# Patient Record
Sex: Female | Born: 1980 | Race: Black or African American | Hispanic: No | Marital: Married | Smoking: Former smoker
Health system: Southern US, Community
[De-identification: ages and names within clinical notes are randomized; demographics above are authoritative.]

## PROBLEM LIST (undated history)

## (undated) DIAGNOSIS — L509 Urticaria, unspecified: Secondary | ICD-10-CM

## (undated) DIAGNOSIS — J302 Other seasonal allergic rhinitis: Secondary | ICD-10-CM

## (undated) DIAGNOSIS — K219 Gastro-esophageal reflux disease without esophagitis: Secondary | ICD-10-CM

## (undated) DIAGNOSIS — T783XXA Angioneurotic edema, initial encounter: Secondary | ICD-10-CM

## (undated) HISTORY — DX: Urticaria, unspecified: L50.9

## (undated) HISTORY — DX: Angioneurotic edema, initial encounter: T78.3XXA

---
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
## 2000-10-15 ENCOUNTER — Emergency Department (HOSPITAL_COMMUNITY): Admission: EM | Admit: 2000-10-15 | Discharge: 2000-10-16 | Payer: Self-pay | Admitting: Emergency Medicine

## 2007-04-21 ENCOUNTER — Inpatient Hospital Stay: Payer: Self-pay | Admitting: Obstetrics and Gynecology

## 2007-08-20 HISTORY — PX: DIAGNOSTIC LAPAROSCOPY: SUR761

## 2008-04-25 ENCOUNTER — Ambulatory Visit (HOSPITAL_COMMUNITY): Admission: AD | Admit: 2008-04-25 | Discharge: 2008-04-25 | Payer: Self-pay | Admitting: Obstetrics and Gynecology

## 2008-04-25 ENCOUNTER — Encounter (INDEPENDENT_AMBULATORY_CARE_PROVIDER_SITE_OTHER): Payer: Self-pay | Admitting: Obstetrics and Gynecology

## 2008-04-25 ENCOUNTER — Encounter: Payer: Self-pay | Admitting: Emergency Medicine

## 2009-01-04 IMAGING — US US OB TRANSVAGINAL
1 series · 13 of 28 positions shown · non-contrast
Comparison: None

CLINICAL DATA: Recent IUD placement, positive pregnancy test

OBSTETRIC <14 WK US AND TRANSVAGINAL OB US
TECHNIQUE: Both transabdominal and transvaginal ultrasound
examinations were performed for complete evaluation of the
gestation as well as the maternal uterus, adnexal regions, and
pelvic cul-de-sac.

[Series 1: unknown · 0.32mm/px · 13 of 63 slices shown]
[im 3/63]
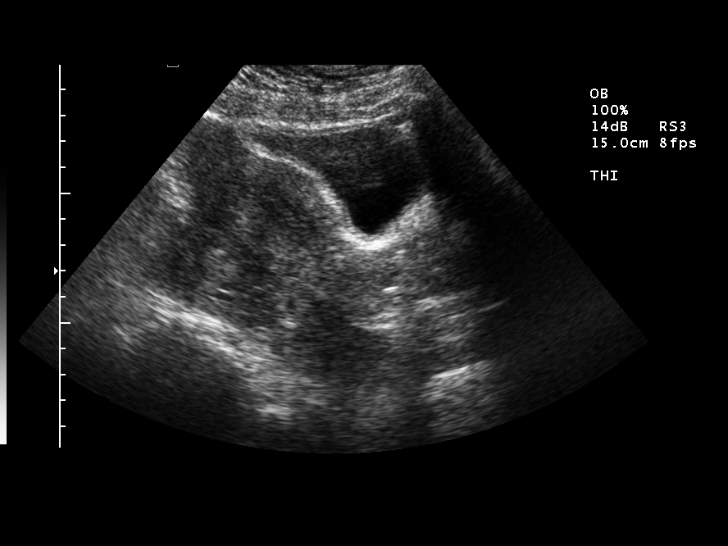
[im 7/63]
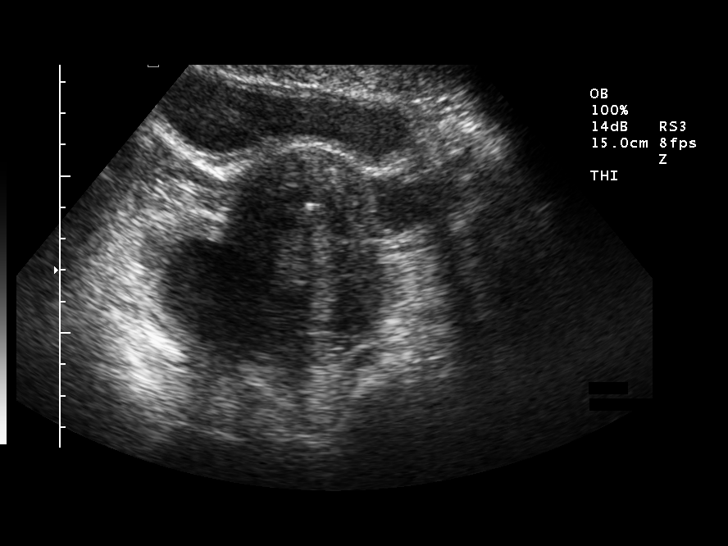
[im 12/63]
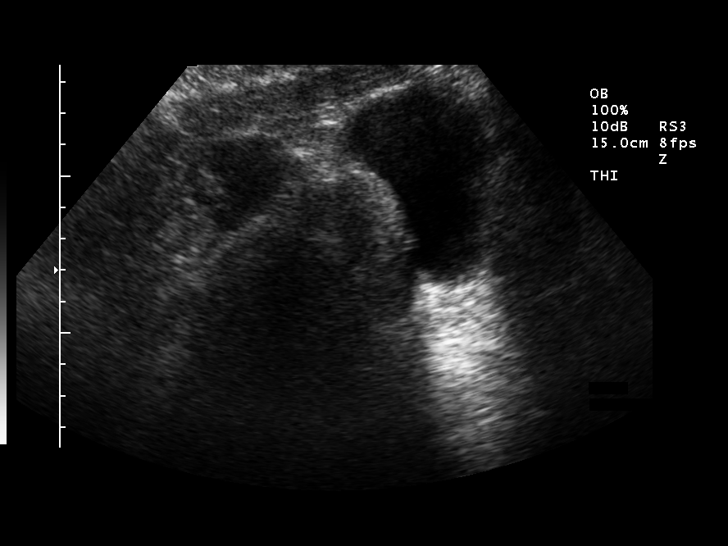
[im 17/63]
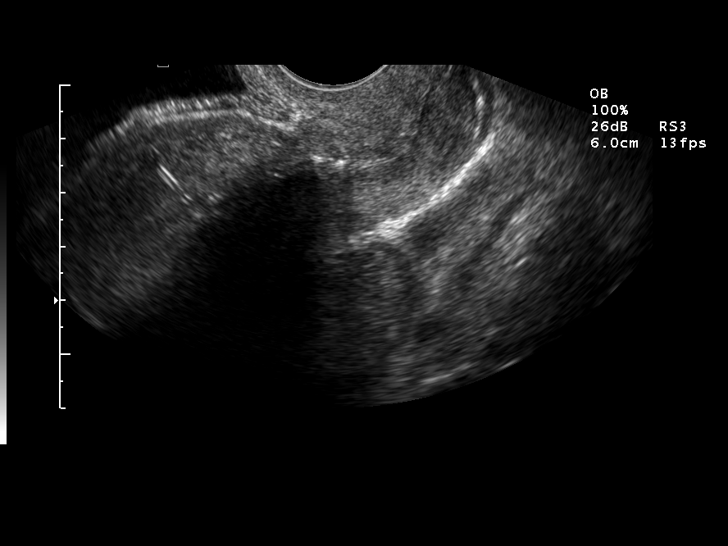
[im 21/63]
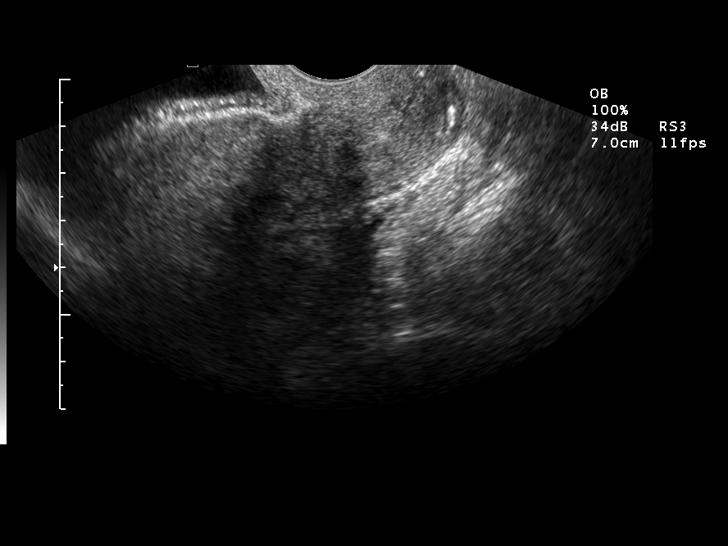
[im 26/63]
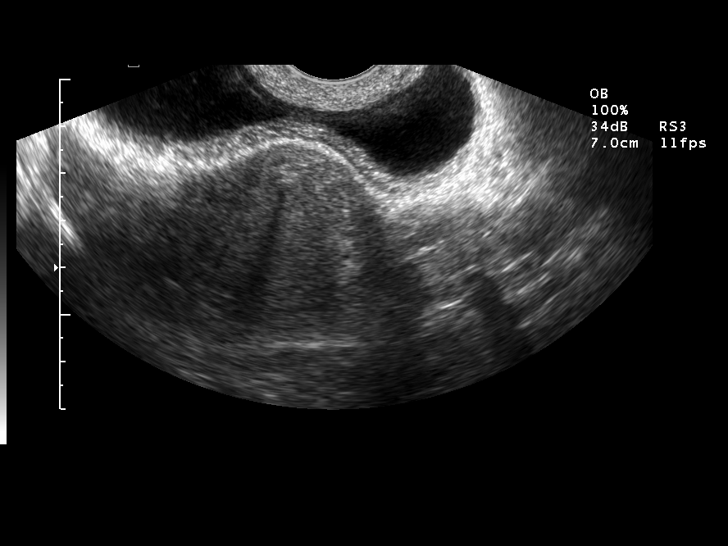
[im 33/63]
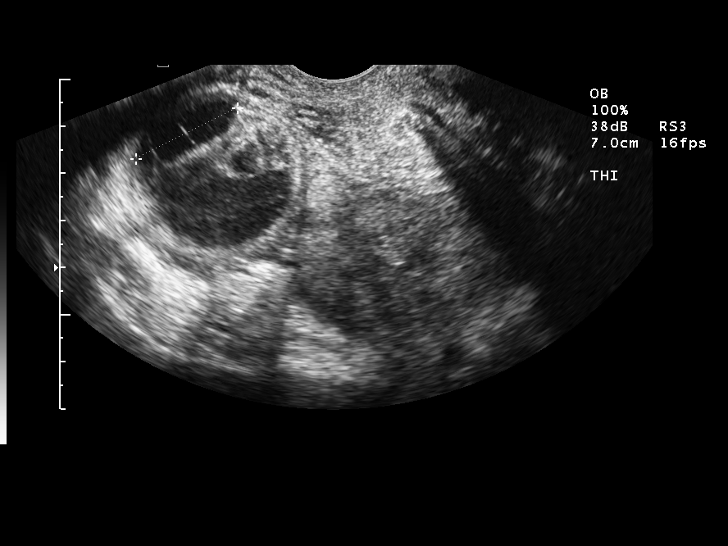
[im 37/63]
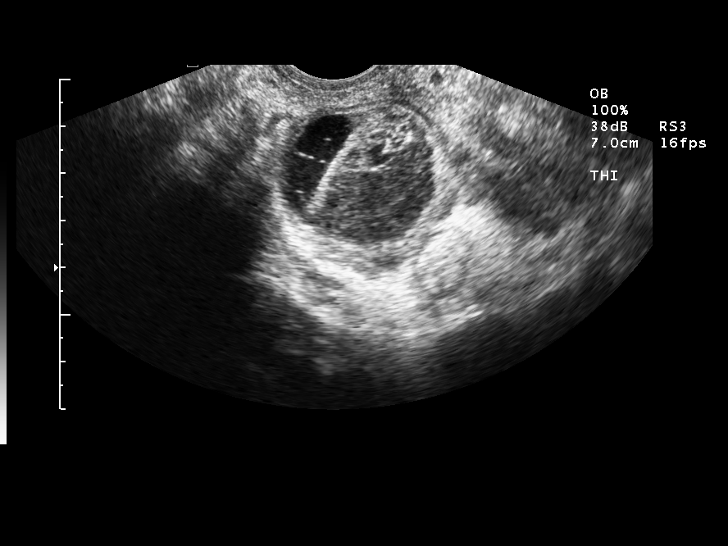
[im 42/63]
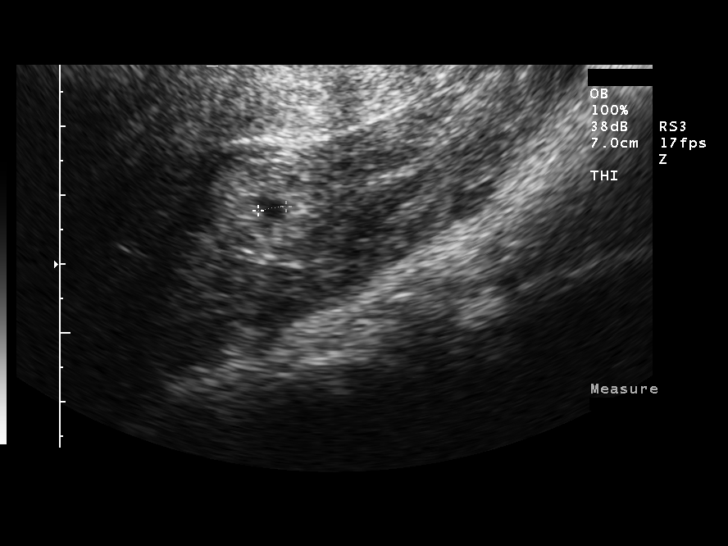
[im 46/63]
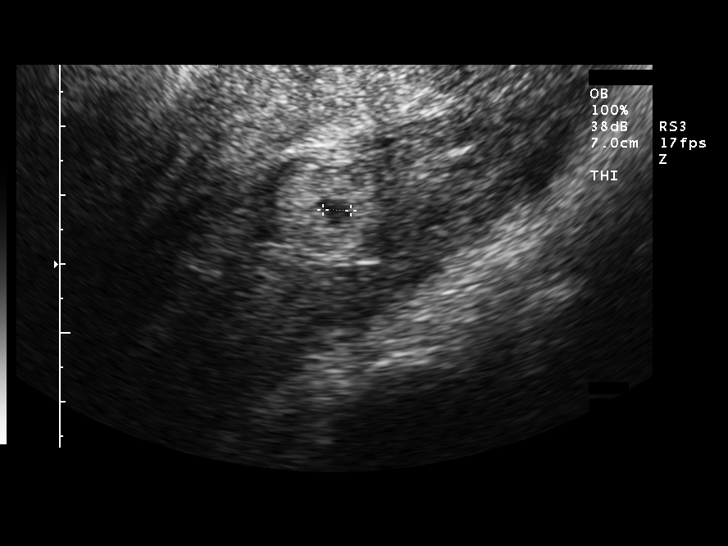
[im 51/63]
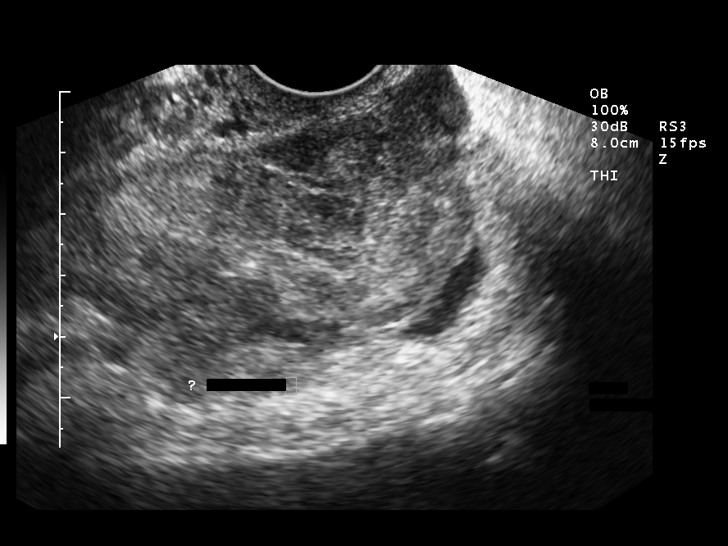
[im 56/63]
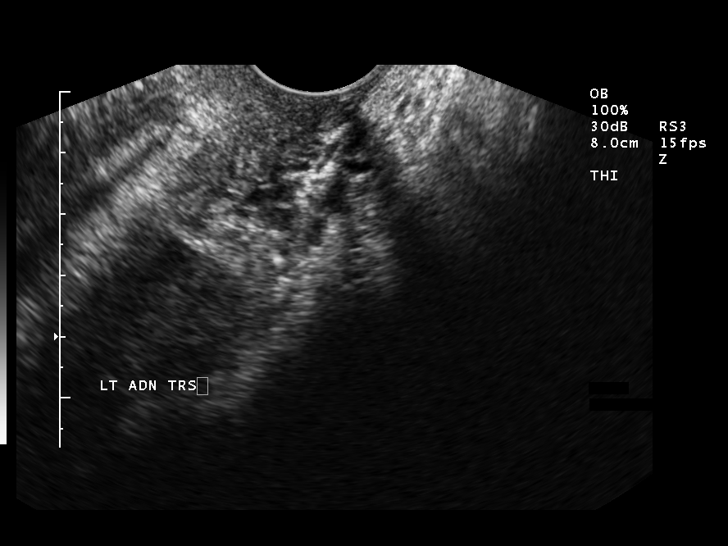
[im 60/63]
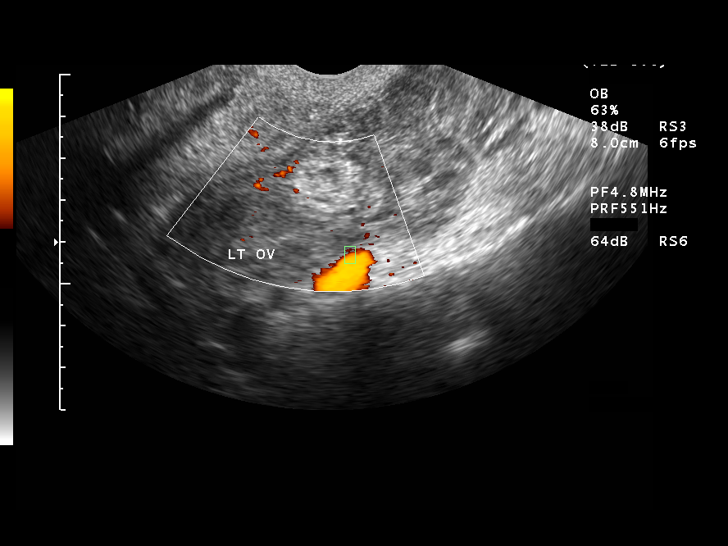

[13 of 28 positions shown; findings below may reference images not displayed]

Intrauterine gestational sac: Not visualized.  An IUD is in place.
Yolk sac: Not visualized
Embryo: Not visualized
Cardiac Activity: Not visualized

There is an echogenic, centrally echopenic mass adjacent to the
left ovary, likely a tubal ring.  Moderate fluid with internal
echoes noted in the cul-de-sac.  The left ovary is normal.

There is a thick-walled right ovarian cyst measuring 3.8 x 3.6 x
3.4 cm with lace-like internal echoes and possible thin internal
septation.
IMPRESSION: Probable tubal ring in the left adnexa consistent with ectopic
pregnancy and moderate fluid with possible internal clot/debris in
the cul-de-sac which may signify rupture.

No intrauterine gestational sac visualized with an IUD in place.

Probable hemorrhagic right ovarian cyst.

Critical test results telephoned to Dr. Edita by Dr. Friedbert at the
time of interpretation on date 04/25/2008 at time [DATE] a.m...

## 2009-09-12 ENCOUNTER — Other Ambulatory Visit: Admission: RE | Admit: 2009-09-12 | Discharge: 2009-09-12 | Payer: Self-pay | Admitting: Family Medicine

## 2011-01-01 NOTE — Op Note (Signed)
NAME:  Teresa Kennedy, Teresa Kennedy              ACCOUNT NO.:  [id number]   MEDICAL RECORD NO.:  [id number]          PATIENT TYPE:  AMB   LOCATION:  SDC                           FACILITY:  [facility]   PHYSICIAN:  Maxie Better, M.D.DATE OF BIRTH:  Jan 22, 1981   DATE OF PROCEDURE:  04/25/2008  DATE OF DISCHARGE:                               OPERATIVE REPORT   PREOPERATIVE DIAGNOSIS:  Presumed ruptured left ectopic pregnancy.   PROCEDURE:  Diagnostic laparoscopy, cauterization of left ovarian cyst  wall, evacuation of hemoperitoneum, and removal of IUD.   POSTOPERATIVE DIAGNOSIS:  Ruptured left ovarian cyst, pregnancy.   ANESTHESIA:  General.   SURGEON:  Maxie Better, MD   ASSISTANT:  None.   PROCEDURE:  Under adequate general anesthesia, the patient was placed in  the dorsal lithotomy position.  She was sterilely prepped and draped in  the usual fashion.  An indwelling Foley catheter was sterilely placed.  Bivalve speculum placed in vagina.  IUD string was visualized and  removed without incident.  Single-tooth tenaculum was placed in the  anterior lip of the cervix.  A Kahn cannula was introduced into the  cervical os and attached tenaculum for manipulation of the uterus.  The  bivalve speculum was then removed.  Attention was then turned to the  abdomen.  Marcaine 0.25% was injected infraumbilically and an  infraumbilical incision was made.  Veress needle was introduced, tested  with normal saline.  Carbon dioxide was insufflated, opening pressure of  6 was noted.  2.5 L of CO2 was insufflated.  Veress needle was removed.  A 10-mm disposable trocar was introduced into the abdomen without  incident.  A lighted video laparoscope was placed through that port.  On  entering the abdominal cavity, hemoperitoneum was noted.  A second  incision was then made suprapubically along the previous Pfannenstiel  skin incision.  A 5-mm port was placed under direct visualization.  Initial inspection  revealed normal right tube and right ovary with  ovarian cyst.  Left ovary appeared to have a distal portion of it  bleeding and the left tube had appeared to be normal, however,  evacuation of clots was necessary in order to further assess the  situation.  A suction apparatus was placed which was limited by its  size.  The skin incision was extended and 5 mm port and exchanged and  the hemoperitoneum was evacuated, approximately 300 mL worth of clotted  blood.  On further inspection, it was noted that again the left tube  appeared normal with the distal end fimbriated had no evidence of  possible tubal abortion.  There was no bleeding at the fimbriated end at  all.  The tube did not appear distended.  There was no evidence of  darkened area on the tube to suggest pregnancy.  The ovarian cyst wall  that was bleeding was cauterized.  Abdomen was irrigated, suctioned,  recauterization of that area until final hemostasis was accomplished.  The clotted material was sent off for pathology.  Decision was then made  to not remove the tube in the light of this finding.  The procedure was  then felt to be complete.  The suprapubic site was removed.  The abdomen  was deflated and the infraumbilical site then removed.  The deep layers  was closed with 0 Vicryl stitch and the skin approximated using  Dermabond.  The instruments in the vagina were removed.  Specimen was  the hemoperitoneum collection sent for possible pregnancy.  Estimated  blood loss from the procedure itself was minimal.  Complication was  none.  The patient tolerated the procedure well and was transferred to  recovery room in stable condition.      Maxie Better, M.D.  Electronically Signed     [place]/MEDQ  D:  04/25/2008  T:  04/26/2008  Job:  [id number]

## 2011-01-18 ENCOUNTER — Other Ambulatory Visit: Payer: Self-pay | Admitting: Obstetrics and Gynecology

## 2011-03-25 LAB — HIV ANTIBODY (ROUTINE TESTING W REFLEX): HIV: NONREACTIVE

## 2011-03-25 LAB — RUBELLA ANTIBODY, IGM: Rubella: IMMUNE

## 2011-03-25 LAB — ANTIBODY SCREEN: Antibody Screen: NEGATIVE

## 2011-03-25 LAB — ABO/RH: RH Type: POSITIVE

## 2011-05-22 LAB — CBC
MCHC: 33.6
RBC: 3.85 — ABNORMAL LOW
RDW: 13.7

## 2011-05-22 LAB — DIFFERENTIAL
Eosinophils Absolute: 0.1
Eosinophils Relative: 1
Lymphocytes Relative: 24
Lymphs Abs: 2.6
Monocytes Absolute: 0.4

## 2011-05-22 LAB — URINALYSIS, ROUTINE W REFLEX MICROSCOPIC
Bilirubin Urine: NEGATIVE
Glucose, UA: NEGATIVE
Hgb urine dipstick: NEGATIVE
Specific Gravity, Urine: 1.023

## 2011-05-22 LAB — GC/CHLAMYDIA PROBE AMP, GENITAL
Chlamydia, DNA Probe: NEGATIVE
GC Probe Amp, Genital: NEGATIVE

## 2011-05-22 LAB — COMPREHENSIVE METABOLIC PANEL
ALT: 12
AST: 18
Albumin: 3.5
Calcium: 9
GFR calc Af Amer: >60
Potassium: 3.2 — ABNORMAL LOW
Sodium: 138
Total Protein: 6.7

## 2011-05-22 LAB — WET PREP, GENITAL: Trich, Wet Prep: NONE SEEN

## 2011-05-22 LAB — HCG, QUANTITATIVE, PREGNANCY: hCG, Beta Chain, Quant, S: 1497 — ABNORMAL HIGH

## 2011-05-22 LAB — ABO/RH: ABO/RH(D): O POS

## 2011-08-21 NOTE — OR Nursing (Signed)
No BMI date of posting

## 2011-10-21 ENCOUNTER — Encounter (HOSPITAL_COMMUNITY): Payer: Self-pay | Admitting: Pharmacist

## 2011-10-25 ENCOUNTER — Other Ambulatory Visit: Payer: Self-pay | Admitting: Obstetrics and Gynecology

## 2011-10-31 ENCOUNTER — Encounter (HOSPITAL_COMMUNITY): Payer: Self-pay

## 2011-11-04 ENCOUNTER — Encounter (HOSPITAL_COMMUNITY)
Admission: RE | Admit: 2011-11-04 | Discharge: 2011-11-04 | Disposition: A | Discharge status: Home / Self Care | Payer: BC Managed Care – PPO | Source: Ambulatory Visit | Attending: Obstetrics and Gynecology | Admitting: Obstetrics and Gynecology

## 2011-11-04 ENCOUNTER — Encounter (HOSPITAL_COMMUNITY): Payer: Self-pay

## 2011-11-04 HISTORY — DX: Gastro-esophageal reflux disease without esophagitis: K21.9

## 2011-11-04 HISTORY — DX: Other seasonal allergic rhinitis: J30.2

## 2011-11-04 LAB — CBC
Platelets: 249 10*3/uL (ref 150–400)
RDW: 14 % (ref 11.5–15.5)
WBC: 10.3 10*3/uL (ref 4.0–10.5)

## 2011-11-04 LAB — SURGICAL PCR SCREEN: Staphylococcus aureus: NEGATIVE

## 2011-11-04 NOTE — Patient Instructions (Addendum)
   Your procedure is scheduled on: Wednesday March 20th  Enter through the [employer] [facility] at: 1 PM Pick up the phone at the desk and dial [phone] and inform us of your arrival.  Please call this number if you have any problems the morning of surgery: [phone]  Remember: Do not eat food after midnight: Tuesday Do not drink clear liquids after: 10:30 am Wednesday Take these medicines the morning of surgery with a SIP OF WATER: none  Do not wear jewelry, make-up, or FINGER nail polish Do not wear lotions, powders, perfumes or deodorant. Do not shave 48 hours prior to surgery. Do not bring valuables to the hospital. Contacts, dentures or bridgework may not be worn into surgery.  Leave suitcase in the car. After Surgery it may be brought to your room. For patients being admitted to the hospital, checkout time is 11:00am the day of discharge.     Remember to use your hibiclens as instructed.Please shower with 1/2 bottle the evening before your surgery and the other 1/2 bottle the morning of surgery. Neck down avoiding private area.

## 2011-11-05 LAB — RPR: RPR Ser Ql: NONREACTIVE

## 2011-11-05 MED ORDER — DEXTROSE 5 % IV SOLN
2.0000 g | INTRAVENOUS | Status: AC
  Administered 2011-11-06: 2 g via INTRAVENOUS
  Filled 2011-11-05: qty 2

## 2011-11-06 ENCOUNTER — Inpatient Hospital Stay (HOSPITAL_COMMUNITY): Payer: BC Managed Care – PPO

## 2011-11-06 ENCOUNTER — Encounter (HOSPITAL_COMMUNITY): Payer: Self-pay | Admitting: *Deleted

## 2011-11-06 ENCOUNTER — Encounter (HOSPITAL_COMMUNITY): Payer: Self-pay

## 2011-11-06 ENCOUNTER — Inpatient Hospital Stay (HOSPITAL_COMMUNITY)
Admission: RE | Admit: 2011-11-06 | Discharge: 2011-11-09 | DRG: 370 | Disposition: A | Discharge status: Home / Self Care | Payer: BC Managed Care – PPO | Source: Ambulatory Visit | Attending: Obstetrics and Gynecology | Admitting: Obstetrics and Gynecology

## 2011-11-06 ENCOUNTER — Encounter (HOSPITAL_COMMUNITY)
Admission: RE | Disposition: A | Discharge status: Home / Self Care | Payer: Self-pay | Source: Ambulatory Visit | Attending: Obstetrics and Gynecology

## 2011-11-06 ENCOUNTER — Encounter (HOSPITAL_COMMUNITY): Payer: Self-pay | Admitting: Anesthesiology

## 2011-11-06 DIAGNOSIS — O9903 Anemia complicating the puerperium: Secondary | ICD-10-CM | POA: Diagnosis present

## 2011-11-06 DIAGNOSIS — Z01818 Encounter for other preprocedural examination: Secondary | ICD-10-CM

## 2011-11-06 DIAGNOSIS — D509 Iron deficiency anemia, unspecified: Secondary | ICD-10-CM | POA: Diagnosis present

## 2011-11-06 DIAGNOSIS — Z302 Encounter for sterilization: Secondary | ICD-10-CM

## 2011-11-06 DIAGNOSIS — O34219 Maternal care for unspecified type scar from previous cesarean delivery: Principal | ICD-10-CM | POA: Diagnosis present

## 2011-11-06 DIAGNOSIS — Z01812 Encounter for preprocedural laboratory examination: Secondary | ICD-10-CM

## 2011-11-06 SURGERY — Surgical Case
Anesthesia: Spinal | Site: Abdomen | Laterality: Bilateral | Wound class: Clean Contaminated

## 2011-11-06 MED ORDER — OXYTOCIN 20 UNITS IN LACTATED RINGERS INFUSION - SIMPLE
INTRAVENOUS | Status: DC | PRN
  Administered 2011-11-06: 20 [IU] via INTRAVENOUS

## 2011-11-06 MED ORDER — ONDANSETRON HCL 4 MG/2ML IJ SOLN: 4.0000 mg | Freq: Three times a day (TID) | INTRAMUSCULAR | Status: DC | PRN

## 2011-11-06 MED ORDER — MEPERIDINE HCL 25 MG/ML IJ SOLN: 6.2500 mg | INTRAMUSCULAR | Status: DC | PRN

## 2011-11-06 MED ORDER — FERROUS SULFATE 325 (65 FE) MG PO TABS
325.0000 mg | ORAL_TABLET | Freq: Two times a day (BID) | ORAL | Status: DC
  Administered 2011-11-07 – 2011-11-09 (×5): 325 mg via ORAL
  Filled 2011-11-06 (×5): qty 1

## 2011-11-06 MED ORDER — DIBUCAINE 1 % RE OINT: 1.0000 "application " | TOPICAL_OINTMENT | RECTAL | Status: DC | PRN

## 2011-11-06 MED ORDER — BISACODYL 10 MG RE SUPP: 10.0000 mg | Freq: Every day | RECTAL | Status: DC | PRN

## 2011-11-06 MED ORDER — NALBUPHINE HCL 10 MG/ML IJ SOLN: 5.0000 mg | INTRAMUSCULAR | Status: DC | PRN

## 2011-11-06 MED ORDER — PHENYLEPHRINE 40 MCG/ML (10ML) SYRINGE FOR IV PUSH (FOR BLOOD PRESSURE SUPPORT)
PREFILLED_SYRINGE | INTRAVENOUS | Status: AC
  Filled 2011-11-06: qty 5

## 2011-11-06 MED ORDER — SODIUM CHLORIDE 0.9 % IJ SOLN: 3.0000 mL | INTRAMUSCULAR | Status: DC | PRN

## 2011-11-06 MED ORDER — KETOROLAC TROMETHAMINE 30 MG/ML IJ SOLN: 30.0000 mg | Freq: Four times a day (QID) | INTRAMUSCULAR | Status: DC | PRN

## 2011-11-06 MED ORDER — NALOXONE HCL 0.4 MG/ML IJ SOLN: 0.4000 mg | INTRAMUSCULAR | Status: DC | PRN

## 2011-11-06 MED ORDER — KETOROLAC TROMETHAMINE 30 MG/ML IJ SOLN
15.0000 mg | Freq: Once | INTRAMUSCULAR | Status: AC | PRN
  Administered 2011-11-06: 30 mg via INTRAVENOUS

## 2011-11-06 MED ORDER — FENTANYL CITRATE 0.05 MG/ML IJ SOLN
INTRAMUSCULAR | Status: DC | PRN
  Administered 2011-11-06: 12.5 ug via INTRATHECAL

## 2011-11-06 MED ORDER — BUPIVACAINE HCL (PF) 0.25 % IJ SOLN
INTRAMUSCULAR | Status: AC
  Filled 2011-11-06: qty 30

## 2011-11-06 MED ORDER — OXYTOCIN 10 UNIT/ML IJ SOLN
INTRAMUSCULAR | Status: AC
  Filled 2011-11-06: qty 4

## 2011-11-06 MED ORDER — LANOLIN HYDROUS EX OINT: 1.0000 "application " | TOPICAL_OINTMENT | CUTANEOUS | Status: DC | PRN

## 2011-11-06 MED ORDER — MORPHINE SULFATE 0.5 MG/ML IJ SOLN
INTRAMUSCULAR | Status: AC
  Filled 2011-11-06: qty 10

## 2011-11-06 MED ORDER — MEASLES, MUMPS & RUBELLA VAC ~~LOC~~ INJ: 0.5000 mL | INJECTION | Freq: Once | SUBCUTANEOUS | Status: DC

## 2011-11-06 MED ORDER — PHENYLEPHRINE HCL 10 MG/ML IJ SOLN
INTRAMUSCULAR | Status: DC | PRN
  Administered 2011-11-06: 40 ug via INTRAVENOUS
  Administered 2011-11-06: 80 ug via INTRAVENOUS
  Administered 2011-11-06: 40 ug via INTRAVENOUS
  Administered 2011-11-06: 80 ug via INTRAVENOUS
  Administered 2011-11-06: 40 ug via INTRAVENOUS
  Administered 2011-11-06: 80 ug via INTRAVENOUS
  Administered 2011-11-06: 40 ug via INTRAVENOUS
  Administered 2011-11-06: 80 ug via INTRAVENOUS

## 2011-11-06 MED ORDER — DIPHENHYDRAMINE HCL 50 MG/ML IJ SOLN: 25.0000 mg | INTRAMUSCULAR | Status: DC | PRN

## 2011-11-06 MED ORDER — EPHEDRINE 5 MG/ML INJ
INTRAVENOUS | Status: AC
  Filled 2011-11-06: qty 10

## 2011-11-06 MED ORDER — PHENYLEPHRINE 40 MCG/ML (10ML) SYRINGE FOR IV PUSH (FOR BLOOD PRESSURE SUPPORT)
PREFILLED_SYRINGE | INTRAVENOUS | Status: AC
  Filled 2011-11-06: qty 15

## 2011-11-06 MED ORDER — BUPIVACAINE IN DEXTROSE 0.75-8.25 % IT SOLN
INTRATHECAL | Status: DC | PRN
  Administered 2011-11-06: 1.4 mg via INTRATHECAL

## 2011-11-06 MED ORDER — DIPHENHYDRAMINE HCL 25 MG PO CAPS: 25.0000 mg | ORAL_CAPSULE | ORAL | Status: DC | PRN

## 2011-11-06 MED ORDER — TETANUS-DIPHTH-ACELL PERTUSSIS 5-2.5-18.5 LF-MCG/0.5 IM SUSP: 0.5000 mL | Freq: Once | INTRAMUSCULAR | Status: DC

## 2011-11-06 MED ORDER — KETOROLAC TROMETHAMINE 30 MG/ML IJ SOLN
INTRAMUSCULAR | Status: AC
  Administered 2011-11-06: 30 mg via INTRAVENOUS
  Filled 2011-11-06: qty 1

## 2011-11-06 MED ORDER — SODIUM CHLORIDE 0.9 % IV SOLN: 1.0000 ug/kg/h | INTRAVENOUS | Status: DC | PRN

## 2011-11-06 MED ORDER — OXYCODONE-ACETAMINOPHEN 5-325 MG PO TABS
1.0000 | ORAL_TABLET | ORAL | Status: DC | PRN
  Administered 2011-11-07 – 2011-11-09 (×8): 1 via ORAL
  Filled 2011-11-06 (×8): qty 1

## 2011-11-06 MED ORDER — ONDANSETRON HCL 4 MG PO TABS: 4.0000 mg | ORAL_TABLET | ORAL | Status: DC | PRN

## 2011-11-06 MED ORDER — HYDROMORPHONE HCL PF 1 MG/ML IJ SOLN: 0.2500 mg | INTRAMUSCULAR | Status: DC | PRN

## 2011-11-06 MED ORDER — ZOLPIDEM TARTRATE 5 MG PO TABS: 5.0000 mg | ORAL_TABLET | Freq: Every evening | ORAL | Status: DC | PRN

## 2011-11-06 MED ORDER — SENNOSIDES-DOCUSATE SODIUM 8.6-50 MG PO TABS
2.0000 | ORAL_TABLET | Freq: Every day | ORAL | Status: DC
  Administered 2011-11-06 – 2011-11-08 (×3): 2 via ORAL

## 2011-11-06 MED ORDER — ONDANSETRON HCL 4 MG/2ML IJ SOLN: 4.0000 mg | INTRAMUSCULAR | Status: DC | PRN

## 2011-11-06 MED ORDER — ATROPINE SULFATE 0.4 MG/ML IJ SOLN
INTRAMUSCULAR | Status: AC
  Filled 2011-11-06: qty 1

## 2011-11-06 MED ORDER — DIPHENHYDRAMINE HCL 25 MG PO CAPS: 25.0000 mg | ORAL_CAPSULE | Freq: Four times a day (QID) | ORAL | Status: DC | PRN

## 2011-11-06 MED ORDER — FLEET ENEMA 7-19 GM/118ML RE ENEM: 1.0000 | ENEMA | Freq: Every day | RECTAL | Status: DC | PRN

## 2011-11-06 MED ORDER — EPHEDRINE SULFATE 50 MG/ML IJ SOLN
INTRAMUSCULAR | Status: DC | PRN
  Administered 2011-11-06: 10 mg via INTRAVENOUS
  Administered 2011-11-06: 15 mg via INTRAVENOUS
  Administered 2011-11-06: 20 mg via INTRAVENOUS
  Administered 2011-11-06 (×2): 5 mg via INTRAVENOUS
  Administered 2011-11-06: 10 mg via INTRAVENOUS

## 2011-11-06 MED ORDER — PROMETHAZINE HCL 25 MG/ML IJ SOLN: 6.2500 mg | INTRAMUSCULAR | Status: DC | PRN

## 2011-11-06 MED ORDER — IBUPROFEN 600 MG PO TABS
600.0000 mg | ORAL_TABLET | Freq: Four times a day (QID) | ORAL | Status: DC
  Administered 2011-11-07 – 2011-11-09 (×11): 600 mg via ORAL
  Filled 2011-11-06 (×11): qty 1

## 2011-11-06 MED ORDER — ONDANSETRON HCL 4 MG/2ML IJ SOLN
INTRAMUSCULAR | Status: DC | PRN
  Administered 2011-11-06: 4 mg via INTRAVENOUS

## 2011-11-06 MED ORDER — SIMETHICONE 80 MG PO CHEW: 80.0000 mg | CHEWABLE_TABLET | ORAL | Status: DC | PRN

## 2011-11-06 MED ORDER — OXYTOCIN 20 UNITS IN LACTATED RINGERS INFUSION - SIMPLE: 125.0000 mL/h | INTRAVENOUS | Status: DC

## 2011-11-06 MED ORDER — CEFAZOLIN SODIUM 1-5 GM-% IV SOLN
1.0000 g | Freq: Three times a day (TID) | INTRAVENOUS | Status: DC
  Administered 2011-11-06 – 2011-11-07 (×2): 1 g via INTRAVENOUS
  Filled 2011-11-06 (×3): qty 50

## 2011-11-06 MED ORDER — MORPHINE SULFATE (PF) 0.5 MG/ML IJ SOLN
INTRAMUSCULAR | Status: DC | PRN
  Administered 2011-11-06: .2 mg via INTRATHECAL

## 2011-11-06 MED ORDER — DIPHENHYDRAMINE HCL 50 MG/ML IJ SOLN: 12.5000 mg | INTRAMUSCULAR | Status: DC | PRN

## 2011-11-06 MED ORDER — BUPIVACAINE HCL (PF) 0.25 % IJ SOLN
INTRAMUSCULAR | Status: DC | PRN
  Administered 2011-11-06: 5 mL

## 2011-11-06 MED ORDER — SIMETHICONE 80 MG PO CHEW
80.0000 mg | CHEWABLE_TABLET | Freq: Three times a day (TID) | ORAL | Status: DC
  Administered 2011-11-06 – 2011-11-09 (×9): 80 mg via ORAL

## 2011-11-06 MED ORDER — IBUPROFEN 600 MG PO TABS: 600.0000 mg | ORAL_TABLET | Freq: Four times a day (QID) | ORAL | Status: DC | PRN

## 2011-11-06 MED ORDER — LACTATED RINGERS IV SOLN
INTRAVENOUS | Status: DC
  Administered 2011-11-06 (×5): via INTRAVENOUS

## 2011-11-06 MED ORDER — FENTANYL CITRATE 0.05 MG/ML IJ SOLN
INTRAMUSCULAR | Status: AC
  Filled 2011-11-06: qty 2

## 2011-11-06 MED ORDER — SCOPOLAMINE 1 MG/3DAYS TD PT72
1.0000 | MEDICATED_PATCH | Freq: Once | TRANSDERMAL | Status: DC
  Administered 2011-11-06: 1.5 mg via TRANSDERMAL

## 2011-11-06 MED ORDER — SODIUM CHLORIDE 0.9 % IJ SOLN: 3.0000 mL | Freq: Two times a day (BID) | INTRAMUSCULAR | Status: DC

## 2011-11-06 MED ORDER — WITCH HAZEL-GLYCERIN EX PADS: 1.0000 "application " | MEDICATED_PAD | CUTANEOUS | Status: DC | PRN

## 2011-11-06 MED ORDER — ONDANSETRON HCL 4 MG/2ML IJ SOLN
INTRAMUSCULAR | Status: AC
  Filled 2011-11-06: qty 2

## 2011-11-06 MED ORDER — MENTHOL 3 MG MT LOZG: 1.0000 | LOZENGE | OROMUCOSAL | Status: DC | PRN

## 2011-11-06 MED ORDER — SODIUM CHLORIDE 0.9 % IV SOLN: 250.0000 mL | INTRAVENOUS | Status: DC

## 2011-11-06 MED ORDER — KETOROLAC TROMETHAMINE 60 MG/2ML IM SOLN: 60.0000 mg | Freq: Once | INTRAMUSCULAR | Status: AC | PRN

## 2011-11-06 MED ORDER — PRENATAL MULTIVITAMIN CH
1.0000 | ORAL_TABLET | Freq: Every day | ORAL | Status: DC
  Administered 2011-11-07 – 2011-11-09 (×3): 1 via ORAL
  Filled 2011-11-06 (×2): qty 1

## 2011-11-06 MED ORDER — METHYLERGONOVINE MALEATE 0.2 MG PO TABS: 0.2000 mg | ORAL_TABLET | ORAL | Status: DC | PRN

## 2011-11-06 MED ORDER — METHYLERGONOVINE MALEATE 0.2 MG/ML IJ SOLN: 0.2000 mg | INTRAMUSCULAR | Status: DC | PRN

## 2011-11-06 SURGICAL SUPPLY — 41 items
APL SKNCLS STERI-STRIP NONHPOA (GAUZE/BANDAGES/DRESSINGS)
BENZOIN TINCTURE PRP APPL 2/3 (GAUZE/BANDAGES/DRESSINGS) IMPLANT
CLOTH BEACON ORANGE TIMEOUT ST (SAFETY) ×2 IMPLANT
CONTAINER PREFILL 10% NBF 15ML (MISCELLANEOUS) IMPLANT
DRESSING TELFA 8X3 (GAUZE/BANDAGES/DRESSINGS) ×2 IMPLANT
ELECT REM PT RETURN 9FT ADLT (ELECTROSURGICAL) ×2
ELECTRODE REM PT RTRN 9FT ADLT (ELECTROSURGICAL) ×1 IMPLANT
EXTRACTOR VACUUM KIWI (MISCELLANEOUS) IMPLANT
EXTRACTOR VACUUM M CUP 4 TUBE (SUCTIONS) IMPLANT
GAUZE SPONGE 4X4 12PLY STRL LF (GAUZE/BANDAGES/DRESSINGS) ×4 IMPLANT
GLOVE BIO SURGEON STRL SZ 6.5 (GLOVE) ×2 IMPLANT
GLOVE BIOGEL PI IND STRL 7.0 (GLOVE) ×2 IMPLANT
GLOVE BIOGEL PI INDICATOR 7.0 (GLOVE) ×2
GOWN PREVENTION PLUS LG XLONG (DISPOSABLE) ×6 IMPLANT
KIT ABG SYR 3ML LUER SLIP (SYRINGE) IMPLANT
NEEDLE HYPO 25X1 1.5 SAFETY (NEEDLE) ×2 IMPLANT
NEEDLE HYPO 25X5/8 SAFETYGLIDE (NEEDLE) IMPLANT
NS IRRIG 1000ML POUR BTL (IV SOLUTION) ×2 IMPLANT
PACK C SECTION WH (CUSTOM PROCEDURE TRAY) ×2 IMPLANT
PAD ABD 7.5X8 STRL (GAUZE/BANDAGES/DRESSINGS) ×2 IMPLANT
RTRCTR C-SECT PINK 25CM LRG (MISCELLANEOUS) IMPLANT
SLEEVE SCD COMPRESS KNEE MED (MISCELLANEOUS) IMPLANT
SPONGE GAUZE 4X4 12PLY (GAUZE/BANDAGES/DRESSINGS) ×2 IMPLANT
STAPLER VISISTAT 35W (STAPLE) IMPLANT
STRIP CLOSURE SKIN 1/2X4 (GAUZE/BANDAGES/DRESSINGS) IMPLANT
SUT CHROMIC GUT AB #0 18 (SUTURE) ×2 IMPLANT
SUT MNCRL 0 VIOLET CTX 36 (SUTURE) ×3 IMPLANT
SUT MON AB 4-0 PS1 27 (SUTURE) IMPLANT
SUT MONOCRYL 0 CTX 36 (SUTURE) ×3
SUT PLAIN 2 0 (SUTURE) ×2
SUT PLAIN ABS 2-0 CT1 27XMFL (SUTURE) ×1 IMPLANT
SUT VIC AB 0 CT1 27 (SUTURE) ×4
SUT VIC AB 0 CT1 27XBRD ANBCTR (SUTURE) ×2 IMPLANT
SUT VIC AB 2-0 CT1 27 (SUTURE) ×2
SUT VIC AB 2-0 CT1 TAPERPNT 27 (SUTURE) ×1 IMPLANT
SUT VICRYL 0 TIES 12 18 (SUTURE) IMPLANT
SYR CONTROL 10ML LL (SYRINGE) ×2 IMPLANT
TAPE CLOTH SURG 4X10 WHT LF (GAUZE/BANDAGES/DRESSINGS) ×2 IMPLANT
TOWEL OR 17X24 6PK STRL BLUE (TOWEL DISPOSABLE) ×4 IMPLANT
TRAY FOLEY CATH 14FR (SET/KITS/TRAYS/PACK) IMPLANT
WATER STERILE IRR 1000ML POUR (IV SOLUTION) IMPLANT

## 2011-11-06 NOTE — Transfer of Care (Signed)
Immediate Anesthesia Transfer of Care Note  Patient: Teresa Kennedy  Procedure(s) Performed: Procedure(s) (LRB): CESAREAN SECTION WITH BILATERAL TUBAL LIGATION (Bilateral)  Patient Location: PACU  Anesthesia Type: Spinal  Level of Consciousness: awake, alert  and oriented  Airway & Oxygen Therapy: Patient Spontanous Breathing  Post-op Assessment: Report given to PACU RN and Post -op Vital signs reviewed and stable  Post vital signs: Reviewed and stable  Complications: No apparent anesthesia complications

## 2011-11-06 NOTE — Anesthesia Procedure Notes (Signed)
Spinal  Patient location during procedure: OR Start time: 11/06/2011 2:45 PM End time: 11/06/2011 2:52 PM Staffing Anesthesiologist: Sandrea Hughs Performed by: anesthesiologist  Preanesthetic Checklist Completed: patient identified, site marked, surgical consent, pre-op evaluation, timeout performed, IV checked, risks and benefits discussed and monitors and equipment checked Spinal Block Patient position: sitting Prep: DuraPrep Patient monitoring: heart rate, cardiac monitor, continuous pulse ox and blood pressure Approach: midline Location: L3-4 Injection technique: single-shot Needle Needle type: Sprotte  Needle gauge: 24 G Needle length: 9 cm Needle insertion depth: 6 cm Assessment Sensory level: T4

## 2011-11-06 NOTE — Anesthesia Postprocedure Evaluation (Signed)
Anesthesia Post Note  Patient: Teresa Kennedy  Procedure(s) Performed: Procedure(s) (LRB): CESAREAN SECTION WITH BILATERAL TUBAL LIGATION (Bilateral)  Anesthesia type: Spinal  Patient location: PACU  Post pain: Pain level controlled  Post assessment: Post-op Vital signs reviewed  Last Vitals:  Filed Vitals:   11/06/11 1615  BP: 106/52  Pulse: 70  Temp:   Resp: 19    Post vital signs: Reviewed  Level of consciousness: awake  Complications: No apparent anesthesia complications

## 2011-11-06 NOTE — Brief Op Note (Signed)
11/06/2011  3:49 PM  PATIENT:  Alvie Heidelberg  31 y.o. female  PRE-OPERATIVE DIAGNOSIS:  Previous Cesarean Section, desire sterilizationTerm Gestation  POST-OPERATIVE DIAGNOSIS:  Previous Cesarean Section, desiresterilizationTerm Gestation  PROCEDURE:  Procedure(s) (LRB):REPEAT LOW TRANSVERSE CESAREAN SECTION . MODIFIED POMEROY BILATERAL TUBAL LIGATION (Bilateral)  SURGEON:  Surgeon(s) and Role:    * Laxmi Choung Cathie Beams, MD - Primary  PHYSICIAN ASSISTANT:   ASSISTANTS: Arlan Organ, CNM   ANESTHESIA:   spinal FINDINGS  LIVE FEMALE W/  CAN X 1. NL TUBES AND OVARIES EBL:  Total I/O In: 1800 [I.V.:1800] Out: 900 [Urine:300; Blood:600]  BLOOD ADMINISTERED:none  DRAINS: none   LOCAL MEDICATIONS USED:  MARCAINE     SPECIMEN:  Source of Specimen:  PORTION OF RIGHT AND LEFT TUBE  DISPOSITION OF SPECIMEN:  PATHOLOGY  COUNTS:  YES  TOURNIQUET:  * No tourniquets in log *  DICTATION: .Other Dictation: Dictation Number   PLAN OF CARE: Admit to inpatient   PATIENT DISPOSITION:  PACU - hemodynamically stable.   Delay start of Pharmacological VTE agent (>24hrs) due to surgical blood loss or risk of bleeding: no

## 2011-11-06 NOTE — Consult Note (Signed)
Neonatology Note:   Attendance at C-section:    I was asked to attend this repeat C/S at term. The mother is a G4P1A2 O pos, GBS unknown. ROM at delivery, fluid clear. Infant vigorous with good spontaneous cry and tone. Needed only minimal bulb suctioning. Ap 9/10. Lungs clear to ausc in DR. To CN to care of Pediatrician.   Deatra James, MD

## 2011-11-06 NOTE — Anesthesia Preprocedure Evaluation (Signed)
Anesthesia Evaluation  Patient identified by MRN, date of birth, ID band Patient awake    Reviewed: Allergy & Precautions, H&P , NPO status , Patient's Chart, lab work & pertinent test results  Airway Mallampati: II TM Distance: >3 FB Neck ROM: full    Dental   Pulmonary  breath sounds clear to auscultation  Pulmonary exam normal       Cardiovascular negative cardio ROS      Neuro/Psych negative neurological ROS  negative psych ROS   GI/Hepatic Neg liver ROS,   Endo/Other  Morbid obesity  Renal/GU negative Renal ROS  negative genitourinary   Musculoskeletal negative musculoskeletal ROS (+)   Abdominal (+) + obese,   Peds negative pediatric ROS (+)  Hematology negative hematology ROS (+)   Anesthesia Other Findings   Reproductive/Obstetrics (+) Pregnancy                           Anesthesia Physical Anesthesia Plan  ASA: III  Anesthesia Plan: Spinal   Post-op Pain Management:    Induction:   Airway Management Planned:   Additional Equipment:   Intra-op Plan:   Post-operative Plan:   Informed Consent: I have reviewed the patients History and Physical, chart, labs and discussed the procedure including the risks, benefits and alternatives for the proposed anesthesia with the patient or authorized representative who has indicated his/her understanding and acceptance.     Plan Discussed with:   Anesthesia Plan Comments:         Anesthesia Quick Evaluation

## 2011-11-07 LAB — CBC
Platelets: 203 10*3/uL (ref 150–400)
RDW: 14 % (ref 11.5–15.5)
WBC: 9.6 10*3/uL (ref 4.0–10.5)

## 2011-11-07 MED ORDER — TETANUS-DIPHTH-ACELL PERTUSSIS 5-2.5-18.5 LF-MCG/0.5 IM SUSP
0.5000 mL | Freq: Once | INTRAMUSCULAR | Status: AC
  Administered 2011-11-07: 0.5 mL via INTRAMUSCULAR
  Filled 2011-11-07: qty 0.5

## 2011-11-07 NOTE — Progress Notes (Signed)
Patient ID: Teresa Kennedy, female   DOB: 28-Jun-1981, 31 y.o.   MRN: [id number]  POSTOPERATIVE DAY # 1 S/P LTCS and BTL   S:         Reports feeling well             Tolerating po intake / denies nausea / denies vomiting / denies flatus / no BM             Bleeding is light             Pain controlled withibuprofen (OTC) and narcotic analgesics including percocet             Up ad lib / ambulatory  Newborn breast feeding     O:  A & O x 3, NAD, pleasant affect             VS: Blood pressure 82/53, pulse 60, temperature 97.8 F (36.6 C), temperature source Oral, resp. rate 16, weight 90.719 kg (200 lb), SpO2 97.00%, unknown if currently breastfeeding.  LABS: WBC/Hgb/Hct/Plts:  9.6/9.7/30.2/203 (03/21 0544)   I&O:     Lungs: Clear and unlabored  Heart: regular rate and rhythm / no mumurs  Abdomen: soft, non-tender, non-distended; hypoactive  BS             Fundus: firm, non-tender, U-2             Dressing intact; no evidence of bleeding   Perineum: intact  Lochia: sm rubra  Extremities: no edema, no calf pain or tenderness, neg Homans  A:        POD # 1 S/P LTCS and BTL             Breastfeeding well             Chronic iron deficiency anemia compounded with ABL anemia  P:        Routine postoperative care              Tdap today             Lactation consultation prn  Maintain oral iron supplementation             Anticipate early d/c on 3/22 pm.  Juanetta Beets, SNM  Marlinda Mike 11/07/2011, 9:09 AM

## 2011-11-07 NOTE — Addendum Note (Signed)
Addendum  created 11/07/11 0820 by Shanon Payor, CRNA   Modules edited:Notes Section

## 2011-11-07 NOTE — Op Note (Signed)
Teresa Kennedy, Teresa Kennedy              ACCOUNT NO.:  [id number]  MEDICAL RECORD NO.:  [id number]  LOCATION:  9104                          FACILITY:  [facility]  PHYSICIAN:  Maxie Better, M.D.DATE OF BIRTH:  [birth year]  DATE OF PROCEDURE:  11/06/2011 DATE OF DISCHARGE:                              OPERATIVE REPORT   PREOPERATIVE DIAGNOSES:  Previous cesarean section, term gestation, desires sterilization.  PROCEDURES:  A repeat cesarean section, Kerr hysterotomy, modified Pomeroy bilateral tubal ligation.  POSTOPERATIVE DIAGNOSES:  Previous cesarean section, term gestation, desires sterilization.  SURGEON:  Maxie Better, M.D.  ASSISTANT:  Arlan Organ, CNM.  ANESTHESIA:  Spinal.  PROCEDURE:  Under adequate spinal anesthesia, the patient was placed in supine position with a left lateral tilt.  She was sterilely prepped and draped in usual fashion.  A 0.25% Marcaine was injected along the previous Pfannenstiel skin incision.  A Pfannenstiel skin incision was then made, carried down to the rectus fascia.  The rectus fascia opened transversely.  Rectus fascia then bluntly and sharply dissected off the rectus muscle in superior and inferior fashion.  The rectus muscle was split in the midline.  The parietal peritoneum was entered sharply and extended.  The bladder peritoneum had adhesions with a previous cesarean section.  However, the vesicouterine peritoneum was opened transversely. The bladder was carefully dissected and displaced inferiorly with a bladder retractor.  A thin lower uterine segment was noted.  A curvilinear low-transverse uterine incision was then made and extended with bandage scissors.  Copious clear amniotic fluid was noted. Subsequent delivery of a live female from the right occiput transverse position was accomplished.  Cord around the neck x1 was reducible.  Baby was bulb suctioned in the abdomen.  Cord was clamped, cut.  The baby was transferred to the  awaiting pediatrician assigned Apgars of 9 and 9 at 1 and 5 minutes.  The placenta was manually removed and cord blood donation was done.  Uterine cavity was cleaned of debris.  Uterine incision had no extension was closed in 2 layers, the first layer with 0 Monocryl in a running locked stitch, second layer was imbricating with 0 Monocryl suture.  The abdomen was then irrigated and suctioned.  The fallopian tubes were identified bilaterally.  The ovaries were noted to be normal.  The midportion of both fallopian tube was grasped with a Babcock.  The underlying mesosalpinx on both side was opened with cautery. The proximal and distal portion of the tubes that was separated was tied with 0 chromic sutures x2 proximally, distally and intervening segment of fallopian tube was removed bilaterally. Omental adhesion to the anterior abdominal wall was lysed.  Once good hemostasis was noted, the parietal peritoneum was closed with 2-0 Vicryl.  The rectus fascia was closed with 0 Vicryl x2.  The subcutaneous area was irrigated, small bleeders cauterized,  interrupted 2-0 plain sutures placed and the skin approximated with Ethicon staples.  SPECIMEN:  Placenta not sent to pathology, portion of right and left fallopian tubes sent to pathology.  ESTIMATED BLOOD LOSS:  600 mL intraoperative fluid to 2600 mL.  Urine output 300 mL of clear urine.  Sponge and instrument counts x2 was correct.  COMPLICATION:  None.  The patient tolerated the procedure well and was transferred to recovery in stable condition.  Weight of the baby is 6 pounds.     Maxie Better, M.D.     [place]/MEDQ  D:  11/06/2011  T:  11/07/2011  Job:  [id number]

## 2011-11-07 NOTE — Anesthesia Postprocedure Evaluation (Signed)
  Anesthesia Post-op Note  Patient: Teresa Kennedy  Procedure(s) Performed: Procedure(s) (LRB): CESAREAN SECTION WITH BILATERAL TUBAL LIGATION (Bilateral)  Patient Location: Mother/Baby  Anesthesia Type: Spinal  Level of Consciousness: awake, alert  and oriented  Airway and Oxygen Therapy: Patient Spontanous Breathing  Post-op Pain: none  Post-op Assessment: Post-op Vital signs reviewed, Patient's Cardiovascular Status Stable, No headache, No backache, No residual numbness and No residual motor weakness  Post-op Vital Signs: Reviewed and stable  Complications: No apparent anesthesia complications

## 2011-11-08 NOTE — Progress Notes (Signed)
POD # 3  Subjective: Pt reports feeling well/ Pain controlled with prescription NSAID's including motrin and percocet Tolerating po/Voiding without problems/ No n/v/Flatus yes but no stool yet-thinks she will be able to have a bowel movement this am Activity: ad lib Bleeding is light Newborn breastfeeding in room this am Pt desires to stay until tomorrow Information for the patient's newborn:  Brendalee, Matthies [[id number]]  female Feeding: breast   Objective: VS: Blood pressure 99/64, pulse 81, temperature 98 F (36.7 C), temperature source Oral, resp. rate 18, weight 200 lb (90.719 kg), SpO2 98.00%, unknown if currently breastfeeding.   LABS:  Basename 11/07/11 0544  WBC 9.6  HGB 9.7*  HCT 30.2*  PLT 203     Physical Exam:  General: alert and cooperative CV: Regular rate and rhythm, S1S2 present or without murmur or extra heart sounds Resp: clear Abdomen: soft, nontender, normal bowel sounds Incision: healing, skin edges not well approximated on the right side-no oozing Uterine Fundus: firm, below umbilicus, nontender Lochia: minimal Ext: extremities normal, atraumatic, no cyanosis or edema and Homans sign is negative, no sign of DVT    A/P: POD # 3/ G4P2022/ S/P LTCS and BTL Hx of chronic iron deficiency anemia compounded by ABL anemia-continue with iron D/c in am SignedLynden Ang, [facility] 11/08/2011, 8:20 AM

## 2011-11-09 MED ORDER — IBUPROFEN 600 MG PO TABS: 600.0000 mg | ORAL_TABLET | Freq: Four times a day (QID) | ORAL | Status: AC

## 2011-11-09 MED ORDER — OXYCODONE-ACETAMINOPHEN 5-325 MG PO TABS: 1.0000 | ORAL_TABLET | ORAL | Status: AC | PRN

## 2011-11-09 MED ORDER — FERROUS SULFATE 325 (65 FE) MG PO TABS: 325.0000 mg | ORAL_TABLET | Freq: Two times a day (BID) | ORAL | Status: AC

## 2011-11-09 NOTE — Discharge Summary (Signed)
  OBSTETRICAL DISCHARGE SUMMARY   Patient ID: Teresa Kennedy MRN: [id number] DOB/AGE: Jul 28, 1981 31 y.o.  Admit date: 11/06/2011 Discharge date:  11/09/2011  Admission Diagnoses:  previous cesarean section at 39 weeks - elective repeat   undesired fertility    Discharge Diagnoses:  term pregnancy delivery by cesarean section POD 3 s/p Cesarean section and BTL Iron deficiency anemia  Reason for Admission: previous cesarean section - undesired fertility for scheduled CS  Prenatal history: G4P2022   EDC : 11/12/2011, by Other [facility]  Prenatal care at [facility]  Primary provider : Cousin Prenatal course complicated by previous c/s, anemia  Prenatal Labs: ABO, Rh: O/Positive/-- (08/06 0000) Antibody: Negative (08/06 0000) Rubella:  Immune RPR: NON REACTIVE (03/18 1100)  HBsAg: Negative (08/06 0000)  HIV: Non-reactive (08/06 0000)   Anesthesia: spinal Procedures: repeat cesarean section and bilateral tubal sterilization Complications: none  Newborn Data:  Gender: female Feeding method : breast Circumcision: n/a Home with mother.  Physical Exam: NAD Lungs - clear Heart - RRR Abdomen - normal BS / non-tender Incision - intact with staples / edges everted / re-aaproximated with steri-strips between staple sites (stapel removal Tuesday at [facility]) Extremities - small dependent edema  Discharge Information: Date: 11/09/2011 Discharge Diagnoses: Term Pregnancy-delivered  Activity: pelvic rest and postoperative activity restrictions x 2 weeks Diet: routine Medications: PNV, Ibuprofen, Colace, Iron and Percocet Condition: stable Instructions: refer to practice specific booklet Discharge to: home Follow up : [facility] at 6 weeks postpartum  Signed: Marlinda Mike 11/09/2011, 11:48 AM

## 2011-11-09 NOTE — Progress Notes (Signed)
Patient ID: Teresa Kennedy, female   DOB: 1981-07-08, 31 y.o.   MRN: [id number]    POSTOPERATIVE DAY # 3 S/P cesarean section with BTL   S:         Reports feeling well - ready to go home             Tolerating po intake / no nausea / no vomiting / + flatus / no BM             Bleeding is light             Pain controlled with motrin and percocet             Up ad lib / ambulatory  Newborn breast-feeding  / female    O:  A & O x 3 NAD             VS: Blood pressure 99/64, pulse 88, temperature 98.3 F (36.8 C), temperature source Oral, resp. rate 17, weight 90.719 kg (200 lb), SpO2 98.00%, unknown if currently breastfeeding.  Lungs: Clear and unlabored  Heart: regular rate and rhythm   Abdomen: soft, non-tender, non-distended             Fundus: firm, non-tender, U-1             Dressing OFF              Incision:  approximated with staples with ruffled skin edge right lateral side                           No erythema / no ecchymosis /  No drainage - but damp incision under panus with everted skin edges (old surgical scar)                           Benzoin and steri-strips applied between staple sites to approximate skin edges / staples intact                           Keep dry - fan or dyer to site 3 times per day   Perineum: no edema  Lochia: light  Extremities: trace edema, no calf pain or tenderness, negative Homans  A:        POD # 3 S/P cesarean section with BTL            Chronic iron deficiency anemia  P:        Routine postoperative care              Continue iron             Staple removal at [facility] Tuesday     Marlinda Mike 11/09/2011, 11:38 AM

## 2014-06-20 ENCOUNTER — Encounter (HOSPITAL_COMMUNITY): Payer: Self-pay | Admitting: *Deleted

## 2015-05-02 ENCOUNTER — Other Ambulatory Visit (HOSPITAL_COMMUNITY)
Admission: RE | Admit: 2015-05-02 | Discharge: 2015-05-02 | Disposition: A | Discharge status: Home / Self Care | Payer: BLUE CROSS/BLUE SHIELD | Source: Ambulatory Visit | Attending: Family Medicine | Admitting: Family Medicine

## 2015-05-02 ENCOUNTER — Other Ambulatory Visit: Payer: Self-pay | Admitting: Family Medicine

## 2015-05-02 DIAGNOSIS — Z01411 Encounter for gynecological examination (general) (routine) with abnormal findings: Secondary | ICD-10-CM | POA: Insufficient documentation

## 2015-05-02 DIAGNOSIS — Z1151 Encounter for screening for human papillomavirus (HPV): Secondary | ICD-10-CM | POA: Diagnosis not present

## 2015-05-03 LAB — CYTOLOGY - PAP

## 2017-10-22 DIAGNOSIS — Z6841 Body Mass Index (BMI) 40.0 and over, adult: Secondary | ICD-10-CM | POA: Diagnosis not present

## 2017-11-11 DIAGNOSIS — H66001 Acute suppurative otitis media without spontaneous rupture of ear drum, right ear: Secondary | ICD-10-CM | POA: Diagnosis not present

## 2017-11-21 DIAGNOSIS — Z13220 Encounter for screening for lipoid disorders: Secondary | ICD-10-CM | POA: Diagnosis not present

## 2017-11-21 DIAGNOSIS — B373 Candidiasis of vulva and vagina: Secondary | ICD-10-CM | POA: Diagnosis not present

## 2017-11-21 DIAGNOSIS — E559 Vitamin D deficiency, unspecified: Secondary | ICD-10-CM | POA: Diagnosis not present

## 2017-11-21 DIAGNOSIS — Z0000 Encounter for general adult medical examination without abnormal findings: Secondary | ICD-10-CM | POA: Diagnosis not present

## 2017-11-21 DIAGNOSIS — Z131 Encounter for screening for diabetes mellitus: Secondary | ICD-10-CM | POA: Diagnosis not present

## 2017-12-26 DIAGNOSIS — Z6841 Body Mass Index (BMI) 40.0 and over, adult: Secondary | ICD-10-CM | POA: Diagnosis not present

## 2017-12-26 DIAGNOSIS — Z713 Dietary counseling and surveillance: Secondary | ICD-10-CM | POA: Diagnosis not present

## 2018-02-04 DIAGNOSIS — Z713 Dietary counseling and surveillance: Secondary | ICD-10-CM | POA: Diagnosis not present

## 2018-02-04 DIAGNOSIS — Z6841 Body Mass Index (BMI) 40.0 and over, adult: Secondary | ICD-10-CM | POA: Diagnosis not present

## 2018-02-05 ENCOUNTER — Other Ambulatory Visit: Payer: Self-pay | Admitting: Nurse Practitioner

## 2018-02-05 ENCOUNTER — Other Ambulatory Visit (HOSPITAL_COMMUNITY)
Admission: RE | Admit: 2018-02-05 | Discharge: 2018-02-05 | Disposition: A | Discharge status: Home / Self Care | Payer: 59 | Source: Ambulatory Visit | Attending: Nurse Practitioner | Admitting: Nurse Practitioner

## 2018-02-05 DIAGNOSIS — Z01419 Encounter for gynecological examination (general) (routine) without abnormal findings: Secondary | ICD-10-CM | POA: Insufficient documentation

## 2018-02-06 LAB — CYTOLOGY - PAP
Chlamydia: NEGATIVE
Diagnosis: NEGATIVE
HPV: NOT DETECTED

## 2018-02-25 DIAGNOSIS — Z6841 Body Mass Index (BMI) 40.0 and over, adult: Secondary | ICD-10-CM | POA: Diagnosis not present

## 2018-02-25 DIAGNOSIS — Z713 Dietary counseling and surveillance: Secondary | ICD-10-CM | POA: Diagnosis not present

## 2018-03-25 DIAGNOSIS — Z713 Dietary counseling and surveillance: Secondary | ICD-10-CM | POA: Diagnosis not present

## 2018-03-25 DIAGNOSIS — Z6841 Body Mass Index (BMI) 40.0 and over, adult: Secondary | ICD-10-CM | POA: Diagnosis not present

## 2018-04-22 DIAGNOSIS — Z713 Dietary counseling and surveillance: Secondary | ICD-10-CM | POA: Diagnosis not present

## 2018-04-22 DIAGNOSIS — Z6841 Body Mass Index (BMI) 40.0 and over, adult: Secondary | ICD-10-CM | POA: Diagnosis not present

## 2018-06-04 DIAGNOSIS — L508 Other urticaria: Secondary | ICD-10-CM | POA: Diagnosis not present

## 2018-07-01 DIAGNOSIS — Z713 Dietary counseling and surveillance: Secondary | ICD-10-CM | POA: Diagnosis not present

## 2018-07-01 DIAGNOSIS — Z6841 Body Mass Index (BMI) 40.0 and over, adult: Secondary | ICD-10-CM | POA: Diagnosis not present

## 2018-07-08 DIAGNOSIS — K130 Diseases of lips: Secondary | ICD-10-CM | POA: Diagnosis not present

## 2018-07-08 DIAGNOSIS — M2550 Pain in unspecified joint: Secondary | ICD-10-CM | POA: Diagnosis not present

## 2018-07-08 DIAGNOSIS — L309 Dermatitis, unspecified: Secondary | ICD-10-CM | POA: Diagnosis not present

## 2018-07-22 DIAGNOSIS — Z6841 Body Mass Index (BMI) 40.0 and over, adult: Secondary | ICD-10-CM | POA: Diagnosis not present

## 2018-07-22 DIAGNOSIS — Z713 Dietary counseling and surveillance: Secondary | ICD-10-CM | POA: Diagnosis not present

## 2018-07-27 ENCOUNTER — Ambulatory Visit: Payer: 59 | Admitting: Allergy

## 2018-07-27 ENCOUNTER — Encounter: Payer: Self-pay | Admitting: Allergy

## 2018-07-27 VITALS — BP 136/86 | HR 98 | Temp 98.4°F | Resp 20 | Ht 61.0 in | Wt 238.2 lb

## 2018-07-27 DIAGNOSIS — L509 Urticaria, unspecified: Secondary | ICD-10-CM

## 2018-07-27 DIAGNOSIS — T783XXA Angioneurotic edema, initial encounter: Secondary | ICD-10-CM

## 2018-07-27 DIAGNOSIS — T783XXD Angioneurotic edema, subsequent encounter: Secondary | ICD-10-CM | POA: Diagnosis not present

## 2018-07-27 DIAGNOSIS — J3089 Other allergic rhinitis: Secondary | ICD-10-CM

## 2018-07-27 HISTORY — DX: Angioneurotic edema, initial encounter: T78.3XXA

## 2018-07-27 MED ORDER — FAMOTIDINE 20 MG PO TABS: 20.0000 mg | ORAL_TABLET | Freq: Two times a day (BID) | ORAL | 5 refills | Status: DC

## 2018-07-27 NOTE — Patient Instructions (Signed)
Get bloodwork  Start allegra 180mg  2 pills in the morning and zyrtec 10mg  2 pills in the evening.  Start Pepcid 20mg  twice a day.  Let me know if you are doing better in 1 week. If not then I will send in Singulair prescription.  Follow up in 2-3 weeks

## 2018-07-27 NOTE — Assessment & Plan Note (Addendum)
Urticaria starting 3 months ago which is worsening and occurring daily. Some relief with allegra 180mg  BID, Pepcid 10mg  BID and benadryl prn. Few episode of finger and lip swelling without any respiratory compromise. TSH and alpha-gal were normal. No specific triggers identified. Denies any changes in diet, medications, personal care products or recent infections. Based on clinical history, she likely has chronic idiopathic urticaria. Discussed with patient, that urticaria is usually caused by release of histamine by cutaneous mast cells but sometimes it is non-histamine mediated. Explained that urticaria is not always associated with allergies, and may be related to other infectious or autoimmune causes. In most cases, the exact etiology for urticaria can not be established and it is considered idiopathic. Discussed the role of adding omalizumab  (anti-IgE antibodies) in controlling urticaria in refractory patients. Meanwhile start the following medications:   Start allegra 180mg  2 pills in the morning and zyrtec 10mg  2 pills in the evening.  Start Pepcid 20mg  twice a day.  Let me know if you are doing better in 1 week. If not then I will send in Singulair prescription. Avoid the following potential triggers: alcohol, tight clothing, NSAIDs.  Get bloodwork as below.

## 2018-07-27 NOTE — Assessment & Plan Note (Signed)
Rhino conjunctivitis symptoms for the past 20 years mainly from spring through summer. Used allegra, nasonex, Flonase in the past with good benefit.   May use over the counter antihistamines such as Zyrtec (cetirizine), Claritin (loratadine), Allegra (fexofenadine), or Xyzal (levocetirizine) daily as needed.  Monitor symptoms.   This is not the cause of her hives and decided not to do any environmental allergy testing as patient is extremely itchy and has urticarial lesions throughout her body.

## 2018-07-27 NOTE — Assessment & Plan Note (Signed)
.   See assessment and plan as above. 

## 2018-07-27 NOTE — Progress Notes (Signed)
New Patient Note  RE: Teresa HeidelbergCrystal M Kennedy MRN: [id number] DOB: 01/10/1981 Date of Office Visit: 07/27/2018  Referring provider: Mila PalmerWolters, Sharon, MD Primary care provider: Mila PalmerWolters, Sharon, MD  Chief Complaint: Urticaria (since end of september 2019; hand swelling and lip swelling )  History of Present Illness: I had the pleasure of seeing Teresa MaskerCrystal Kennedy for initial evaluation at the [facility] on 07/27/2018. She is a 37 y.o. female, who is referred here by Teresa PalmerWolters, Sharon, MD for the evaluation of urticaria. She is accompanied today by her spouse who provided/contributed to the history.   Hives: Rash started about 3 months ago. Mainly occurs on her legs, arms, torso. Describes them as raised, erythematous, and pruritic. Used to occur less frequently but now having it daily. Individual rashes lasts about a few hours. No ecchymosis upon resolution. Associated symptoms include: finger swelling at times, lip swelling but no trouble breathing. Suspected triggers are unknown. Denies any fevers, chills, changes in medications, foods, personal care products or recent infections. She has tried the following therapies: allegra 180mg  BID, Pepcid 10mg  BID, benadryl prn with some benefit. Zyrtec causes drowsiness and Claritin does not work. Did not try systemic steroids as in the past they caused abdominal cramping. Currently on allegra 180mg  BID, Pepcid 10mg  BID.  Previous work up includes: TSH and alpha-gal normal. Previous history of rash/hives: no. Patient is up to date with the following cancer screening tests: pap smears.  Rhinitis: She reports symptoms of rhinorrhea, itchy eyes, PND. Symptoms have been going on for 20 years. The symptoms are present spring through summer. Anosmia: no. She has used allegra, nasonex, Flonase with fair improvement in symptoms. Sinus infections: no. Previous work up includes: no.  Assessment and Plan: Teresa CosierCrystal is a 37 y.o. female  with: Urticaria Urticaria starting 3 months ago which is worsening and occurring daily. Some relief with allegra 180mg  BID, Pepcid 10mg  BID and benadryl prn. Few episode of finger and lip swelling without any respiratory compromise. TSH and alpha-gal were normal. No specific triggers identified. Denies any changes in diet, medications, personal care products or recent infections. Based on clinical history, she likely has chronic idiopathic urticaria. Discussed with patient, that urticaria is usually caused by release of histamine by cutaneous mast cells but sometimes it is non-histamine mediated. Explained that urticaria is not always associated with allergies, and may be related to other infectious or autoimmune causes. In most cases, the exact etiology for urticaria can not be established and it is considered idiopathic. Discussed the role of adding omalizumab  (anti-IgE antibodies) in controlling urticaria in refractory patients. Meanwhile start the following medications:   Start allegra 180mg  2 pills in the morning and zyrtec 10mg  2 pills in the evening.  Start Pepcid 20mg  twice a day.  Let me know if you are doing better in 1 week. If not then I will send in Singulair prescription. Avoid the following potential triggers: alcohol, tight clothing, NSAIDs.  Get bloodwork as below.  Angioedema of lips See assessment and plan as above.  Other allergic rhinitis Rhino conjunctivitis symptoms for the past 20 years mainly from spring through summer. Used allegra, nasonex, Flonase in the past with good benefit.   May use over the counter antihistamines such as Zyrtec (cetirizine), Claritin (loratadine), Allegra (fexofenadine), or Xyzal (levocetirizine) daily as needed.  Monitor symptoms.   This is not the cause of her hives and decided not to do any environmental allergy testing as patient is extremely itchy and has  urticarial lesions throughout her body.   Return in about 3 weeks (around  08/17/2018).  Meds ordered this encounter  Medications  . famotidine (PEPCID) 20 MG tablet    Sig: Take 1 tablet (20 mg total) by mouth 2 (two) times daily.    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  Rhino conjunctivitis: yes Food allergy: no Medication allergy: no Hymenoptera allergy:  Throat tightness as a child one time.  Urticaria: yes Eczema:no History of recurrent infections suggestive of immunodeficency: no  Diagnostics: None.  Past Medical History: Patient Active Problem List   Diagnosis Date Noted  . Urticaria 07/27/2018  . 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 Other allergic rhinitis 07/27/2018  . Postpartum care following cesarean delivery (3/20) 11/07/2011   Past Medical History:  Diagnosis Date  . 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 Asthma    mild asthma-no inhaler-seasonal allergy and stressed induced  . GERD (gastroesophageal reflux disease)    no problems for several years. no meds at present  . Seasonal allergies   . Urticaria    Past Surgical History: Past Surgical History:  Procedure Laterality Date  . CESAREAN SECTION  2008  . CRYOTHERAPY  1999  . DIAGNOSTIC LAPAROSCOPY  2009   ectopic   Medication List:  Current Outpatient Medications  Medication Sig Dispense Refill  . fexofenadine (ALLEGRA) 180 MG tablet Take 180 mg by mouth 2 (two) times daily.    . diphenhydrAMINE (BENADRYL) 25 MG tablet Take 25 mg by mouth at bedtime as needed. For congestion    . famotidine (PEPCID) 20 MG tablet Take 1 tablet (20 mg total) by mouth 2 (two) times daily. 60 tablet 5   No current facility-administered medications for this visit.    Allergies: No Known Allergies Social History: Social History   Socioeconomic History  . Marital status: Single     Spouse name: Not on file  . Number of children: Not on file  . Years of education: Not on file  . Highest education level: Not on file  Occupational History  . Not on file  Social Needs  . Financial resource strain: Not on file  . Food insecurity:    Worry: Not on file    Inability: Not on file  . Transportation needs:    Medical: Not on file    Non-medical: Not on file  Tobacco Use  . Smoking status: Former Smoker    Last attempt to quit: 11/03/2009    Years since quitting: 8.7  . Smokeless tobacco: Never Used  Substance and Sexual Activity  . Alcohol use: Yes  . Drug use: No  . Sexual activity: Not on file  Lifestyle  . Physical activity:    Days per week: Not on file    Minutes per session: Not on file  . Stress: Not on file  Relationships  . Social connections:    Talks on phone: Not on file    Gets together: Not on file    Attends religious service: Not on file    Active member of club or organization: Not on file    Attends meetings of clubs or organizations: Not on file  Relationship status: Not on file  Other Topics Concern  . Not on file  Social History Narrative  . Not on file   Lives in a [address] year old home. Smoking: denies Occupation: patient account representative  Environmental History: Water Damage/mildew in the house: no Carpet in the family room: yes Carpet in the bedroom: yes Heating: electric Cooling: central Pet: no  Family History: Family History  Problem Relation Age of Onset  . Allergic rhinitis Mother   . Asthma Brother   . Eczema Neg Hx   . Urticaria Neg Hx    Problem                               Relation Asthma                                   Brother  Eczema                                No  Food allergy                          No  Allergic rhino conjunctivitis     Mother   Review of Systems  Constitutional: Negative for appetite change, chills, fever and unexpected weight change.  HENT: Negative for congestion and  rhinorrhea.   Eyes: Negative for itching.  Respiratory: Negative for cough, chest tightness, shortness of breath and wheezing.   Cardiovascular: Negative for chest pain.  Gastrointestinal: Negative for abdominal pain.  Genitourinary: Negative for difficulty urinating.  Skin: Positive for rash.  Allergic/Immunologic: Positive for environmental allergies. Negative for food allergies.  Neurological: Negative for headaches.   Objective: BP 136/86 (BP Location: Left Arm, Patient Position: Sitting, Cuff Size: Large)   Pulse 98   Temp 98.4 F (36.9 C) (Oral)   Resp 20   Ht 5\' 1"  (1.549 m)   Wt 238 lb 3.2 oz (108 kg)   SpO2 97%   BMI 45.01 kg/m  Body mass index is 45.01 kg/m. Physical Exam  Constitutional: She is oriented to person, place, and time. She appears well-developed and well-nourished.  HENT:  Head: Normocephalic and atraumatic.  Right Ear: External ear normal.  Left Ear: External ear normal.  Nose: Nose normal.  Mouth/Throat: Oropharynx is clear and moist.  Eyes: Conjunctivae and EOM are normal.  Neck: Neck supple.  Cardiovascular: Normal rate, regular rhythm and normal heart sounds. Exam reveals no gallop and no friction rub.  No murmur heard. Pulmonary/Chest: Effort normal and breath sounds normal. She has no wheezes. She has no rales.  Abdominal: Soft.  Lymphadenopathy:    She has no cervical adenopathy.  Neurological: She is alert and oriented to person, place, and time.  Skin: Skin is warm. Rash noted.  Multiple urticarial lesions on upper extremities, lower extremities b/l and abdominal area.  Psychiatric: She has a normal mood and affect. Her behavior is normal.  Nursing note and vitals reviewed.  The plan was reviewed with the patient/family, and all questions/concerned were addressed.  It was my pleasure to see Ashante today and participate in her care. Please feel free to contact me with any questions or concerns.  Sincerely,  Wyline Mood, DO Allergy &  Immunology  [facility]  [facility] office: [phone] [place] [phone]

## 2018-08-01 LAB — CBC WITH DIFFERENTIAL/PLATELET
BASOS ABS: 0 10*3/uL (ref 0.0–0.2)
Basos: 0 %
EOS (ABSOLUTE): 0.1 10*3/uL (ref 0.0–0.4)
Eos: 1 %
Hematocrit: 38.1 % (ref 34.0–46.6)
Hemoglobin: 12.7 g/dL (ref 11.1–15.9)
IMMATURE GRANS (ABS): 0 10*3/uL (ref 0.0–0.1)
Immature Granulocytes: 0 %
LYMPHS: 23 %
Lymphocytes Absolute: 2.1 10*3/uL (ref 0.7–3.1)
MCH: 29.5 pg (ref 26.6–33.0)
MCHC: 33.3 g/dL (ref 31.5–35.7)
MCV: 89 fL (ref 79–97)
Monocytes Absolute: 0.4 10*3/uL (ref 0.1–0.9)
Monocytes: 5 %
NEUTROS ABS: 6.6 10*3/uL (ref 1.4–7.0)
NEUTROS PCT: 71 %
PLATELETS: 309 10*3/uL (ref 150–450)
RBC: 4.3 x10E6/uL (ref 3.77–5.28)
RDW: 13 % (ref 12.3–15.4)
WBC: 9.2 10*3/uL (ref 3.4–10.8)

## 2018-08-01 LAB — COMPREHENSIVE METABOLIC PANEL
ALK PHOS: 91 IU/L (ref 39–117)
ALT: 10 IU/L (ref 0–32)
AST: 12 IU/L (ref 0–40)
Albumin/Globulin Ratio: 1.5 (ref 1.2–2.2)
Albumin: 4 g/dL (ref 3.5–5.5)
BUN/Creatinine Ratio: 18 (ref 9–23)
BUN: 13 mg/dL (ref 6–20)
Bilirubin Total: 0.4 mg/dL (ref 0.0–1.2)
CHLORIDE: 102 mmol/L (ref 96–106)
CO2: 19 mmol/L — AB (ref 20–29)
Calcium: 9 mg/dL (ref 8.7–10.2)
Creatinine, Ser: 0.72 mg/dL (ref 0.57–1.00)
GFR calc non Af Amer: 106 mL/min/{1.73_m2} (ref >59)
GFR, EST AFRICAN AMERICAN: 122 mL/min/{1.73_m2} (ref >59)
GLUCOSE: 87 mg/dL (ref 65–99)
Globulin, Total: 2.6 g/dL (ref 1.5–4.5)
Potassium: 3.8 mmol/L (ref 3.5–5.2)
Sodium: 139 mmol/L (ref 134–144)
TOTAL PROTEIN: 6.6 g/dL (ref 6.0–8.5)

## 2018-08-01 LAB — ANA: ANA: NEGATIVE

## 2018-08-01 LAB — C3 AND C4
COMPLEMENT C3, SERUM: 134 mg/dL (ref 82–167)
COMPLEMENT C4, SERUM: 46 mg/dL — AB (ref 14–44)

## 2018-08-01 LAB — CHRONIC URTICARIA: cu index: 6 (ref <10)

## 2018-08-20 ENCOUNTER — Encounter: Payer: Self-pay | Admitting: Allergy

## 2018-08-20 ENCOUNTER — Ambulatory Visit: Payer: 59 | Admitting: Allergy

## 2018-08-20 VITALS — BP 118/72 | HR 76 | Resp 16

## 2018-08-20 DIAGNOSIS — T783XXD Angioneurotic edema, subsequent encounter: Secondary | ICD-10-CM | POA: Diagnosis not present

## 2018-08-20 DIAGNOSIS — J3089 Other allergic rhinitis: Secondary | ICD-10-CM

## 2018-08-20 DIAGNOSIS — L509 Urticaria, unspecified: Secondary | ICD-10-CM

## 2018-08-20 NOTE — Assessment & Plan Note (Signed)
Past history - Urticaria starting 3 months ago which is worsening and occurring daily. Some relief with allegra 180mg  BID, Pepcid 10mg  BID and benadryl prn. Few episode of finger and lip swelling without any respiratory compromise. TSH and alpha-gal were normal. No specific triggers identified. Denies any changes in diet, medications, personal care products or recent infections. Interim history - Bloodwork unremarkable. Currently on zyrtec 20mg  BID and Pepcid 20mg  BID with good benefit. Minimal hives but zyrtec seems to make her tired.  Take xyzal 5mg  daily at night. If it does not control hives then may take xyzal 5mg  2 pills at night as long as it does not cause drowsiness.   Continue with zyrtec 20mg  in the morning. May switch to xyzal 5mg  if hives are stable.   Continue with Pepcid 20mg  twice a day.  If no hives for 4 weeks then decrease Pepcid 20mg  to once a day.  If no hives for another 4 weeks then stop Pepcid.  If no hives for another 4 weeks then decrease morning zyrtec to 10mg  in the morning. Avoid the following potential triggers: alcohol, tight clothing, NSAIDs.

## 2018-08-20 NOTE — Progress Notes (Signed)
Follow Up Note  RE: Teresa Kennedy M Kennedy MRN: [id number] DOB: 12/16/1980 Date of Office Visit: 08/20/2018  Referring provider: No ref. provider found Primary care provider: Mila PalmerWolters, Sharon, MD  Chief Complaint: Urticaria  History of Present Illness: I had the pleasure of seeing Teresa Kennedy for a follow up visit at the [facility] on 08/20/2018. She is a 38 y.o. female, who is being followed for urticaria, angioedema, allergic rhinitis. Today she is here for regular follow up visit. Her previous allergy office visit was on 07/27/2018 with Dr. Selena BattenKim.   Urticaria/angioedema Currently zyrtec 20mg  BID and does notice some difficulty waking up in the mornings. She also has been taking naps during the day. Taking Pepcid 20mg  BID. The zyrtec seems to work better than the [employer]. Minimal hives since on this regimen. No swelling episodes since the last visit.   Other allergic rhinitis Asymptomatic.   Assessment and Plan: Teresa Kennedy is a 38 y.o. female with: Urticaria Past history - Urticaria starting 3 months ago which is worsening and occurring daily. Some relief with allegra 180mg  BID, Pepcid 10mg  BID and benadryl prn. Few episode of finger and lip swelling without any respiratory compromise. TSH and alpha-gal were normal. No specific triggers identified. Denies any changes in diet, medications, personal care products or recent infections. Interim history - Bloodwork unremarkable. Currently on zyrtec 20mg  BID and Pepcid 20mg  BID with good benefit. Minimal hives but zyrtec seems to make her tired.  Take xyzal 5mg  daily at night. If it does not control hives then may take xyzal 5mg  2 pills at night as long as it does not cause drowsiness.   Continue with zyrtec 20mg  in the morning. May switch to xyzal 5mg  if hives are stable.   Continue with Pepcid 20mg  twice a day.  If no hives for 4 weeks then decrease Pepcid 20mg  to once a day.  If no hives for another 4 weeks then stop  Pepcid.  If no hives for another 4 weeks then decrease morning zyrtec to 10mg  in the morning. Avoid the following potential triggers: alcohol, tight clothing, NSAIDs.   Angioedema of lips Resolved.   Other allergic rhinitis Past history - Rhino conjunctivitis symptoms for the past 20 years mainly from spring through summer. Used allegra, nasonex, Flonase in the past with good benefit.  Interim history - asymptomatic.   May use over the counter antihistamines such as Zyrtec (cetirizine), Claritin (loratadine), Allegra (fexofenadine), or Xyzal (levocetirizine) daily as needed.  Return in about 3 months (around 11/19/2018).  Diagnostics: None.  Medication List:  Current Outpatient Medications  Medication Sig Dispense Refill  . cetirizine (ZYRTEC) 10 MG tablet Take 20 mg by mouth daily.     . diphenhydrAMINE (BENADRYL) 25 MG tablet Take 25 mg by mouth at bedtime as needed. For congestion    . famotidine (PEPCID) 20 MG tablet Take 1 tablet (20 mg total) by mouth 2 (two) times daily. 60 tablet 5  . levocetirizine (XYZAL) 5 MG tablet Take 5 mg by mouth every evening.     No current facility-administered medications for this visit.    Allergies: No Known Allergies I reviewed her past medical history, social history, family history, and environmental history and no significant changes have been reported from previous visit on 07/27/2018.  Review of Systems  Constitutional: Negative for appetite change, chills, fever and unexpected weight change.  HENT: Negative for congestion and rhinorrhea.   Eyes: Negative for itching.  Respiratory: Negative for cough, chest  tightness, shortness of breath and wheezing.   Cardiovascular: Negative for chest pain.  Gastrointestinal: Negative for abdominal pain.  Genitourinary: Negative for difficulty urinating.  Skin: Positive for rash.  Allergic/Immunologic: Positive for environmental allergies. Negative for food allergies.  Neurological: Negative for  headaches.   Objective: BP 118/72   Pulse 76   Resp 16  There is no height or weight on file to calculate BMI. Physical Exam  Constitutional: She is oriented to person, place, and time. She appears well-developed and well-nourished.  HENT:  Head: Normocephalic and atraumatic.  Right Ear: External ear normal.  Left Ear: External ear normal.  Nose: Nose normal.  Mouth/Throat: Oropharynx is clear and moist.  Eyes: Conjunctivae and EOM are normal.  Neck: Neck supple.  Cardiovascular: Normal rate, regular rhythm and normal heart sounds. Exam reveals no gallop and no friction rub.  No murmur heard. Pulmonary/Chest: Effort normal and breath sounds normal. She has no wheezes. She has no rales.  Abdominal: Soft.  Lymphadenopathy:    She has no cervical adenopathy.  Neurological: She is alert and oriented to person, place, and time.  Skin: Skin is warm. No rash noted.  Psychiatric: She has a normal mood and affect. Her behavior is normal.  Nursing note and vitals reviewed.  Previous notes and tests were reviewed. The plan was reviewed with the patient/family, and all questions/concerned were addressed.  It was my pleasure to see Teresa Kennedy today and participate in her care. Please feel free to contact me with any questions or concerns.  Sincerely,  Wyline Mood, DO Allergy & Immunology  [facility] office: [phone] [facility] office: [phone]

## 2018-08-20 NOTE — Assessment & Plan Note (Signed)
Resolved

## 2018-08-20 NOTE — Assessment & Plan Note (Signed)
Past history - Rhino conjunctivitis symptoms for the past 20 years mainly from spring through summer. Used allegra, nasonex, Flonase in the past with good benefit.  Interim history - asymptomatic.   May use over the counter antihistamines such as Zyrtec (cetirizine), Claritin (loratadine), Allegra (fexofenadine), or Xyzal (levocetirizine) daily as needed.

## 2018-08-20 NOTE — Patient Instructions (Addendum)
Urticaria  Take xyzal 5mg  daily at night. If it does not control hives then may take xyzal 5mg  2 pills at night as long as it does not cause drowsiness.   Continue with zyrtec 20mg  in the morning.  Continue with pepcid 20mg  twice a day.  If no hives for 4 weeks then decrease pepcid 20mg  to once a day. If no hives for another 4 weeks then stop pepcid. If no hives for another 4 weeks then decrease morning zyrtec to 10mg  in the morning.  Follow up in 3 months

## 2018-08-26 DIAGNOSIS — Z6841 Body Mass Index (BMI) 40.0 and over, adult: Secondary | ICD-10-CM | POA: Diagnosis not present

## 2018-08-26 DIAGNOSIS — Z713 Dietary counseling and surveillance: Secondary | ICD-10-CM | POA: Diagnosis not present

## 2018-09-23 DIAGNOSIS — Z713 Dietary counseling and surveillance: Secondary | ICD-10-CM | POA: Diagnosis not present

## 2018-09-23 DIAGNOSIS — Z6841 Body Mass Index (BMI) 40.0 and over, adult: Secondary | ICD-10-CM | POA: Diagnosis not present

## 2018-11-23 ENCOUNTER — Ambulatory Visit: Payer: 59 | Admitting: Allergy

## 2019-03-31 ENCOUNTER — Other Ambulatory Visit: Payer: Self-pay | Admitting: Family Medicine

## 2019-03-31 DIAGNOSIS — Z1231 Encounter for screening mammogram for malignant neoplasm of breast: Secondary | ICD-10-CM

## 2019-05-18 ENCOUNTER — Other Ambulatory Visit: Payer: Self-pay

## 2019-05-18 ENCOUNTER — Ambulatory Visit
Admission: RE | Admit: 2019-05-18 | Discharge: 2019-05-18 | Disposition: A | Discharge status: Home / Self Care | Payer: 59 | Source: Ambulatory Visit | Attending: Family Medicine | Admitting: Family Medicine

## 2019-05-18 DIAGNOSIS — Z1231 Encounter for screening mammogram for malignant neoplasm of breast: Secondary | ICD-10-CM

## 2019-10-21 ENCOUNTER — Ambulatory Visit: Payer: 59 | Attending: Family

## 2019-10-21 DIAGNOSIS — Z23 Encounter for immunization: Secondary | ICD-10-CM | POA: Insufficient documentation

## 2019-10-21 NOTE — Progress Notes (Signed)
   Covid-19 Vaccination Clinic  Name:  Teresa Kennedy    MRN: [id number] DOB: 16-Apr-1981  10/21/2019  [name] was observed post Covid-19 immunization for 15 minutes without incident. She was provided with Vaccine Information Sheet and instruction to access the V-Safe system.   [name] was instructed to call 911 with any severe reactions post vaccine: Marland Kitchen Difficulty breathing  . Swelling of face and throat  . A fast heartbeat  . A bad rash all over body  . Dizziness and weakness   Immunizations Administered    Name Date Dose VIS Date Route   Moderna COVID-19 Vaccine 10/21/2019 11:48 AM 0.5 mL 07/20/2019 Intramuscular   Manufacturer: Moderna   Lot: [id number]   NDC: 27618-485-92

## 2019-11-23 ENCOUNTER — Ambulatory Visit: Payer: 59 | Attending: Family

## 2019-11-23 DIAGNOSIS — Z23 Encounter for immunization: Secondary | ICD-10-CM

## 2019-11-23 NOTE — Progress Notes (Signed)
   Covid-19 Vaccination Clinic  Name:  Teresa Kennedy    MRN: [id number] DOB: November 29, 1980  11/23/2019  Teresa Kennedy was observed post Covid-19 immunization for 15 minutes without incident. She was provided with Vaccine Information Sheet and instruction to access the V-Safe system.   Teresa Kennedy was instructed to call 911 with any severe reactions post vaccine: Marland Kitchen Difficulty breathing  . Swelling of face and throat  . A fast heartbeat  . A bad rash all over body  . Dizziness and weakness   Immunizations Administered    Name Date Dose VIS Date Route   Moderna COVID-19 Vaccine 11/23/2019 12:11 PM 0.5 mL 07/20/2019 Intramuscular   Manufacturer: Moderna   Lot: [id number]   NDC: 84132-440-10

## 2020-06-29 ENCOUNTER — Ambulatory Visit: Payer: 59 | Attending: Family

## 2020-06-29 DIAGNOSIS — Z23 Encounter for immunization: Secondary | ICD-10-CM

## 2020-09-27 NOTE — Progress Notes (Signed)
   Covid-19 Vaccination Clinic  Name:  SANAA ZILBERMAN    MRN: [id number] DOB: 1981-05-13  09/27/2020  Ms. Moga was observed post Covid-19 immunization for 15 minutes without incident. She was provided with Vaccine Information Sheet and instruction to access the V-Safe system.   Ms. Porco was instructed to call 911 with any severe reactions post vaccine: Marland Kitchen Difficulty breathing  . Swelling of face and throat  . A fast heartbeat  . A bad rash all over body  . Dizziness and weakness   Immunizations Administered    Name Date Dose VIS Date Route   Moderna Covid-19 Booster Vaccine 06/29/2020 12:00 PM 0.25 mL 06/07/2020 Intramuscular   Manufacturer: Moderna   Lot: [id number]   NDC: 83094-076-80

## 2021-05-08 ENCOUNTER — Ambulatory Visit: Payer: 59

## 2021-05-15 ENCOUNTER — Encounter: Payer: Self-pay | Admitting: Family Medicine

## 2021-05-15 ENCOUNTER — Ambulatory Visit: Payer: Self-pay | Admitting: Family Medicine

## 2021-05-15 VITALS — BP 104/70 | HR 85 | Ht 60.5 in | Wt 218.0 lb

## 2021-05-15 DIAGNOSIS — Z789 Other specified health status: Secondary | ICD-10-CM

## 2021-05-15 NOTE — Progress Notes (Signed)
  Subjective:     Patient ID: Teresa Kennedy, female   DOB: Jun 06, 1981, 40 y.o.   MRN: [id number]  HPI Teresa Kennedy presents to the employee health and wellness clinic today for her required wellness visit for her insurance. Her PCP is Dr. Kateri Plummer. She states she is going to start setting goals and working on losing weight through diet and exercise, has a friend as an Event organiser partner. She denies any problems or concerns today.   Review of Systems  Constitutional:  Negative for chills, fatigue, fever and unexpected weight change.  HENT:  Negative for congestion, ear pain, sinus pressure, sinus pain and sore throat.   Eyes:  Negative for discharge and visual disturbance.  Respiratory:  Negative for cough, shortness of breath and wheezing.   Cardiovascular:  Negative for chest pain and leg swelling.  Gastrointestinal:  Negative for abdominal pain, blood in stool, constipation, diarrhea, nausea and vomiting.  Genitourinary:  Negative for difficulty urinating and hematuria.  Skin:  Negative for color change.  Neurological:  Negative for dizziness, weakness, light-headedness and headaches.  Hematological:  Negative for adenopathy.  All other systems reviewed and are negative.     Objective:   Physical Exam Vitals reviewed.  Constitutional:      General: She is not in acute distress.    Appearance: Normal appearance. She is well-developed.  HENT:     Head: Normocephalic and atraumatic.  Eyes:     General:        Right eye: No discharge.        Left eye: No discharge.  Cardiovascular:     Rate and Rhythm: Normal rate and regular rhythm.     Heart sounds: Normal heart sounds.  Pulmonary:     Effort: Pulmonary effort is normal. No respiratory distress.     Breath sounds: Normal breath sounds.  Musculoskeletal:     Cervical back: Neck supple.  Skin:    General: Skin is warm and dry.  Neurological:     Mental Status: She is alert and oriented to person, place, and time.  Psychiatric:         Mood and Affect: Mood normal.        Behavior: Behavior normal.   Today's Vitals   05/15/21 1534  BP: 104/70  Pulse: 85  SpO2: 100%  Weight: 218 lb (98.9 kg)  Height: 5' 0.5" (1.537 m)   Body mass index is 41.87 kg/m.      Assessment:     Participant in health and wellness plan     Plan:     Keep all appts with PCP. Encouraged healthy diet and exercise. Due for mammogram. F/u here prn.

## 2021-10-12 ENCOUNTER — Telehealth: Payer: Self-pay | Admitting: Oncology

## 2021-10-12 NOTE — Telephone Encounter (Signed)
Entered in error

## 2021-11-19 ENCOUNTER — Other Ambulatory Visit: Payer: Self-pay | Admitting: Family Medicine

## 2021-11-19 DIAGNOSIS — Z1231 Encounter for screening mammogram for malignant neoplasm of breast: Secondary | ICD-10-CM

## 2021-11-29 ENCOUNTER — Inpatient Hospital Stay: Admission: RE | Admit: 2021-11-29 | Payer: Self-pay | Source: Ambulatory Visit

## 2021-12-04 ENCOUNTER — Other Ambulatory Visit: Payer: Self-pay | Admitting: Family Medicine

## 2021-12-04 DIAGNOSIS — Z1231 Encounter for screening mammogram for malignant neoplasm of breast: Secondary | ICD-10-CM

## 2021-12-07 DIAGNOSIS — Z1231 Encounter for screening mammogram for malignant neoplasm of breast: Secondary | ICD-10-CM

## 2022-03-19 ENCOUNTER — Encounter: Payer: Self-pay | Admitting: Nurse Practitioner

## 2022-03-19 ENCOUNTER — Ambulatory Visit: Payer: Self-pay | Admitting: Nurse Practitioner

## 2022-03-19 VITALS — BP 102/78 | HR 93

## 2022-03-19 DIAGNOSIS — B372 Candidiasis of skin and nail: Secondary | ICD-10-CM

## 2022-03-19 DIAGNOSIS — Z789 Other specified health status: Secondary | ICD-10-CM

## 2022-03-19 MED ORDER — NYSTATIN 100000 UNIT/GM EX POWD: 1.0000 | Freq: Three times a day (TID) | CUTANEOUS | 1 refills | Status: DC

## 2022-03-19 NOTE — Progress Notes (Signed)
Subjective:  Patient ID: Teresa Kennedy, female    DOB: 02-Jul-1981  Age: 41 y.o. MRN: [id number]  CC: wellness exam  HPI Ernesha CHRISTLE NOLTING presents for wellness exam visit for insurance benefit.  Patient has a PCP: Dr. Kateri Plummer with [facility].  PMH significant for: Anxiety. Not on any medications at this time other than a multi-vitamin Last labs per PCP were completed: August 2022, has appointment Thursday for yearly physical and labs.    Health Maintenance:  Mammogram: had one at 38. Planning to rescheduled this soon.   Smoker: former- stopped 5-6 years ago. Was smoking 1 pack a week-started at age 67.   Immunizations: up to date.  Lifestyle: Diet- has made dietary changes over the past 3 weeks for weight loss.  Exercise- was walking and dancing until recently due to feeling "weird"  States she has been feeling "weird", foggy, and extremely tired. Reports having dry skin patches under breast. Reports SBP is normally in the 90s).      Past Medical History:  Diagnosis Date   Angioedema of lips 07/27/2018   Asthma    mild asthma-no inhaler-seasonal allergy and stressed induced   GERD (gastroesophageal reflux disease)    no problems for several years. no meds at present   Seasonal allergies    Urticaria     Past Surgical History:  Procedure Laterality Date   CESAREAN SECTION  2008   CRYOTHERAPY  1999   DIAGNOSTIC LAPAROSCOPY  2009   ectopic    Outpatient Medications Prior to Visit  Medication Sig Dispense Refill   cetirizine (ZYRTEC) 10 MG tablet Take 20 mg by mouth daily.      Multiple Vitamins-Minerals (WOMENS MULTIVITAMIN PO) Take by mouth.     diphenhydrAMINE (BENADRYL) 25 MG tablet Take 25 mg by mouth at bedtime as needed. For congestion (Patient not taking: Reported on 05/15/2021)     famotidine (PEPCID) 20 MG tablet Take 1 tablet (20 mg total) by mouth 2 (two) times daily. (Patient not taking: Reported on 05/15/2021) 60 tablet 5   levocetirizine (XYZAL) 5 MG  tablet Take 5 mg by mouth every evening. (Patient not taking: Reported on 05/15/2021)     No facility-administered medications prior to visit.    ROS Review of Systems  Constitutional:  Positive for fatigue.  Respiratory:  Negative for shortness of breath.   Cardiovascular:  Negative for chest pain and leg swelling.  Gastrointestinal:  Negative for constipation, diarrhea, nausea and vomiting.  Endocrine: Positive for polyphagia. Negative for polydipsia and polyuria.    Objective:  BP 102/78   Pulse 93   SpO2 100%   Physical Exam Constitutional:      General: She is not in acute distress. HENT:     Head: Normocephalic.  Cardiovascular:     Rate and Rhythm: Normal rate and regular rhythm.     Heart sounds: Normal heart sounds.  Pulmonary:     Effort: Pulmonary effort is normal.     Breath sounds: Normal breath sounds.  Musculoskeletal:        General: Normal range of motion.  Skin:    General: Skin is warm.     Comments: Candida infection under bilateral breast.   Neurological:     General: No focal deficit present.     Mental Status: She is alert and oriented to person, place, and time.  Psychiatric:        Mood and Affect: Mood normal.        Behavior:  Behavior normal.      Assessment & Plan:   Olita was seen today for wellness exam.  Diagnoses and all orders for this visit:  Participant in health and wellness plan Adult wellness physical was conducted today. Importance of diet and exercise were discussed in detail. Discussed symptoms of fatigue, fogginess, etc... Recommended further evaluation with labs. Vitals are stable at this time.  After discussion with patient, she has opted to wait until Thursday to obtain labs with her PCP as well as work-up of symptoms. Discussed that patient can return as needed.  Preventative health exams needed: Mammogram  Patient was advised yearly wellness exam  Intertriginous candidiasis Discussed self care measures at home.  Nystatin to site.  -     nystatin (MYCOSTATIN/NYSTOP) powder; Apply 1 Application topically 3 (three) times daily as needed.   No orders of the defined types were placed in this encounter.   Meds ordered this encounter  Medications   nystatin (MYCOSTATIN/NYSTOP) powder    Sig: Apply 1 Application topically 3 (three) times daily.    Dispense:  30 g    Refill:  1    Follow-up: As needed.

## 2022-08-27 ENCOUNTER — Ambulatory Visit: Payer: Self-pay | Admitting: Nurse Practitioner

## 2022-08-27 ENCOUNTER — Encounter: Payer: Self-pay | Admitting: Nurse Practitioner

## 2022-08-27 VITALS — BP 116/80 | HR 84 | Temp 97.7°F

## 2022-08-27 DIAGNOSIS — J069 Acute upper respiratory infection, unspecified: Secondary | ICD-10-CM

## 2022-08-27 MED ORDER — BENZONATATE 100 MG PO CAPS: 100.0000 mg | ORAL_CAPSULE | Freq: Two times a day (BID) | ORAL | 0 refills | Status: DC | PRN

## 2022-08-27 NOTE — Progress Notes (Signed)
Acute Office Visit  Subjective:     Patient ID: Teresa Kennedy, female    DOB: 1980-12-01, 42 y.o.   MRN: [id number]  Chief Complaint  Patient presents with   not feeling well    HPI Patient presents today for not feeling well, symptoms stared Thursday morning.  She started experiencing a sore throat and 2 days later on Saturday she began to experience fatigue, slight dry cough, and headaches. Reports she tested negative for COVID on Sunday.   Denies fevers, chills, congestion, sinus pain, or body aches Reports Hx of asthma in the past. Denies SOB or wheezing.  Has been taking severe cold alka seltzer to help alleviate symptoms.   Review of Systems  Constitutional:  Positive for malaise/fatigue. Negative for chills and fever.  HENT:  Positive for sore throat (only at night, better in the am). Negative for congestion, ear discharge, ear pain and sinus pain.   Respiratory:  Positive for cough and sputum production. Negative for shortness of breath and wheezing.   Cardiovascular:  Negative for chest pain.  Gastrointestinal:  Negative for constipation, diarrhea, nausea and vomiting.  Musculoskeletal:  Negative for joint pain and myalgias.  Neurological:  Positive for headaches. Negative for dizziness.        Objective:    BP 116/80   Pulse 84   Temp 97.7 F (36.5 C)   SpO2 97%    Physical Exam Constitutional:      General: She is not in acute distress. HENT:     Head: Normocephalic.     Right Ear: Tympanic membrane, ear canal and external ear normal.     Left Ear: Tympanic membrane, ear canal and external ear normal.     Nose: Nose normal.     Right Turbinates: Not enlarged.     Left Turbinates: Not enlarged.     Right Sinus: No maxillary sinus tenderness or frontal sinus tenderness.     Left Sinus: No maxillary sinus tenderness or frontal sinus tenderness.     Mouth/Throat:     Tongue: No lesions.     Pharynx: No oropharyngeal exudate or posterior oropharyngeal  erythema.     Tonsils: No tonsillar exudate.     Comments: Geographic tongue Eyes:     Pupils: Pupils are equal, round, and reactive to light.  Cardiovascular:     Rate and Rhythm: Normal rate and regular rhythm.     Heart sounds: Normal heart sounds.  Pulmonary:     Effort: Pulmonary effort is normal.     Breath sounds: Normal breath sounds.  Musculoskeletal:        General: Normal range of motion.  Neurological:     General: No focal deficit present.     Mental Status: She is alert and oriented to person, place, and time.     No results found for any visits on 08/27/22.      Assessment & Plan:   Problem List Items Addressed This Visit   None Visit Diagnoses     Viral upper respiratory tract infection    -  Primary   Relevant Medications   benzonatate (TESSALON) 100 MG capsule     Suspect likely viral infection, encouraged supportive care measures and symptomatic treatment. Encouraged use of humidifier.  Out of treatment window for flu testing/treatment.  Encouraged to follow-up in 1-2 weeks as needed or sooner if symptoms persist or fail to improve.   Meds ordered this encounter  Medications   benzonatate (TESSALON) 100 MG  capsule    Sig: Take 1 capsule (100 mg total) by mouth 2 (two) times daily as needed for cough.    Dispense:  20 capsule    Refill:  0    Order Specific Question:   Supervising Provider    Answer:   Odis Luster [[id number]]   As needed.   Lurena Joiner, NP

## 2022-11-25 ENCOUNTER — Other Ambulatory Visit (HOSPITAL_COMMUNITY)
Admission: RE | Admit: 2022-11-25 | Discharge: 2022-11-25 | Disposition: A | Discharge status: Home / Self Care | Payer: 59 | Source: Ambulatory Visit | Attending: Nurse Practitioner | Admitting: Nurse Practitioner

## 2022-11-25 DIAGNOSIS — Z124 Encounter for screening for malignant neoplasm of cervix: Secondary | ICD-10-CM | POA: Insufficient documentation

## 2022-11-28 LAB — CYTOLOGY - PAP
Comment: NEGATIVE
High risk HPV: NEGATIVE

## 2022-12-03 ENCOUNTER — Ambulatory Visit
Admission: RE | Admit: 2022-12-03 | Discharge: 2022-12-03 | Disposition: A | Discharge status: Home / Self Care | Payer: Self-pay | Source: Ambulatory Visit | Attending: Family Medicine | Admitting: Family Medicine

## 2022-12-03 DIAGNOSIS — Z1231 Encounter for screening mammogram for malignant neoplasm of breast: Secondary | ICD-10-CM

## 2022-12-09 ENCOUNTER — Other Ambulatory Visit: Payer: Self-pay

## 2023-01-14 ENCOUNTER — Other Ambulatory Visit: Payer: Self-pay

## 2024-01-05 ENCOUNTER — Other Ambulatory Visit: Payer: Self-pay | Admitting: Family Medicine

## 2024-01-05 DIAGNOSIS — Z1231 Encounter for screening mammogram for malignant neoplasm of breast: Secondary | ICD-10-CM

## 2024-01-08 ENCOUNTER — Ambulatory Visit
Admission: RE | Admit: 2024-01-08 | Discharge: 2024-01-08 | Disposition: A | Discharge status: Home / Self Care | Payer: Self-pay | Source: Ambulatory Visit | Attending: Family Medicine | Admitting: Family Medicine

## 2024-01-08 DIAGNOSIS — Z1231 Encounter for screening mammogram for malignant neoplasm of breast: Secondary | ICD-10-CM

## 2024-03-03 ENCOUNTER — Ambulatory Visit (INDEPENDENT_AMBULATORY_CARE_PROVIDER_SITE_OTHER)

## 2024-03-03 VITALS — BP 88/60 | HR 106 | Temp 97.8°F | Ht 60.5 in | Wt 224.0 lb

## 2024-03-03 DIAGNOSIS — Z1211 Encounter for screening for malignant neoplasm of colon: Secondary | ICD-10-CM | POA: Diagnosis not present

## 2024-03-03 DIAGNOSIS — Z0000 Encounter for general adult medical examination without abnormal findings: Secondary | ICD-10-CM | POA: Insufficient documentation

## 2024-03-03 DIAGNOSIS — I959 Hypotension, unspecified: Secondary | ICD-10-CM | POA: Insufficient documentation

## 2024-03-03 NOTE — Assessment & Plan Note (Signed)
 BP 88/60 on initial check, 95/62 on recheck. Patient reports that her blood pressure has always run low. She is asymptomatic. Reports that she has not eaten anything today and has not had much to drink. Advised her to increase her fluid intake and notify if she begins feeling faint or dizzy upon standing. Will cont to monitor.

## 2024-03-03 NOTE — Assessment & Plan Note (Signed)
 Getting FBW today to include CBC, CMP, Lipid, A1c, TSH, and Hep C Screen. Answered all patient questions. Went over and encouraged/ordered age-appropriate health screenings to include colonoscopy and pap smear. Patient is up-to-date on all other screenings. No vaccines today. Encouraged patient to aim for 150+ minutes of physical activity per week or just increase their physical activity in general in combination with a well balanced diet that prioritizes protein and fiber to support a healthy lifestyle. Will plan for next physical in 1 year, sooner PRN. Patient verbalized understanding and was in agreement with the plan.

## 2024-03-03 NOTE — Patient Instructions (Signed)
 It was nice to see you today!  As we discussed in clinic:  -We will get fasting blood work form you next week and you will hear from me via MyChart with the results and further recommendations -If you begin to feel dizzy or faint upon standing, please let me know as it could be related to your low blood pressure.  -I have placed an order for your first colonoscopy. The office will call and get this scheduled.   I will plan to see you back in one year for your physical. It was nice to meet you!  If you have any problems before your next visit feel free to message me via MyChart (minor issues or questions) or call the office, otherwise you may reach out to schedule an office visit.  Thank you! Saddie Sacks, PA-C

## 2024-03-03 NOTE — Progress Notes (Signed)
 New Patient Office Visit  Subjective    Patient ID: Teresa Kennedy, female    DOB: Nov 12, 1980  Age: 43 y.o. MRN: [id number]  CC:  Chief Complaint  Patient presents with   Annual Exam    Physical    HPI Teresa Kennedy presents to establish care. Was previously seen by Dr. Kip at [facility].   PMH: Seasonal allergies on Zyrtec PRN  LMP: Has IUD; Does not have periods Has hx of abnormal pap smear, gets them done through OB/GYN. Due in May 2025. Reports she is in the process of establishing care with new OB.  Tobacco use: Previous smoker. On and off for 5 years. Has quit smoking. Alcohol use: Socially Drug use: Denies Marital status: Married, lives with Husband, Daughter, Son Employment: Previously worked at [employer] in [place]. 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  Screenings:  Colon Cancer: Due in September. Referral placed today Lung Cancer: N/A Breast Cancer: Had Mammogram 01/08/24 - Normal Diabetes: Checking A1c with labs HLD: Checking Lipid panel with labs   Outpatient Encounter Medications as of 03/03/2024  Medication Sig   cetirizine (ZYRTEC) 10 MG tablet Take 20 mg by mouth daily.    Multiple Vitamins-Minerals (WOMENS MULTIVITAMIN PO) Take by mouth.   [DISCONTINUED] benzonatate  (TESSALON ) 100 MG capsule Take 1 capsule (100 mg total) by mouth 2 (two) times daily as needed for cough.   [DISCONTINUED] nystatin  (MYCOSTATIN /NYSTOP ) powder Apply 1 Application topically 3 (three) times daily. (Patient not taking: Reported on 08/27/2022)   No facility-administered encounter medications on file as of 03/03/2024.    Past Medical History:  Diagnosis Date   Angioedema of lips 07/27/2018   Asthma    mild asthma-no inhaler-seasonal allergy and stressed induced   GERD (gastroesophageal reflux disease)    no problems for several years. no meds at present   Seasonal allergies    Urticaria     Past Surgical History:  Procedure Laterality Date   CESAREAN SECTION  2008    CRYOTHERAPY  1999   DIAGNOSTIC LAPAROSCOPY  2009   ectopic    Family History  Problem Relation Age of Onset   Allergic rhinitis Mother    Asthma Brother    Eczema Neg Hx    Urticaria Neg Hx    BRCA 1/2 Neg Hx    Breast cancer Neg Hx     Social History   Socioeconomic History   Marital status: Married    Spouse name: Not on file   Number of children: Not on file   Years of education: Not on file   Highest education level: Some college, no degree  Occupational History   Not on file  Tobacco Use   Smoking status: Former    Current packs/day: 0.00    Types: Cigarettes    Quit date: 11/03/2009    Years since quitting: 14.3   Smokeless tobacco: Never  Substance and Sexual Activity   Alcohol use: Yes   Drug use: No   Sexual activity: Not on file  Other Topics Concern   Not on file  Social History Narrative   Not on file   Social Drivers of Health   Financial Resource Strain: Low Risk  (03/02/2024)   Overall Financial Resource Strain (CARDIA)    Difficulty of Paying Living Expenses: Not very hard  Food Insecurity: Food Insecurity Present (03/02/2024)   Hunger Vital Sign    Worried About Running Out of Food in the Last Year: Sometimes true    Ran  Out of Food in the Last Year: Never true  Transportation Needs: No Transportation Needs (03/02/2024)   PRAPARE - Administrator, Civil Service (Medical): No    Lack of Transportation (Non-Medical): No  Physical Activity: Inactive (03/02/2024)   Exercise Vital Sign    Days of Exercise per Week: 0 days    Minutes of Exercise per Session: Not on file  Stress: No Stress Concern Present (03/02/2024)   [employer] of Occupational Health - Occupational Stress Questionnaire    Feeling of Stress: Only a little  Social Connections: Moderately Integrated (03/02/2024)   Social Connection and Isolation Panel    Frequency of Communication with Friends and Family: More than three times a week    Frequency of Social  Gatherings with Friends and Family: Once a week    Attends Religious Services: 1 to 4 times per year    Active Member of [employer] or Organizations: No    Attends Engineer, structural: Not on file    Marital Status: Married  Catering manager Violence: Not on file    ROS   Per HPI   Objective    BP (!) 88/60   Pulse (!) 106   Temp 97.8 F (36.6 C) (Oral)   Ht 5' 0.5 (1.537 m)   Wt 224 lb 0.6 oz (101.6 kg)   SpO2 97%   BMI 43.03 kg/m   Physical Exam Constitutional:      General: She is not in acute distress.    Appearance: Normal appearance.  HENT:     Right Ear: Tympanic membrane normal.     Left Ear: Tympanic membrane normal.     Ears:     Comments: TM scarring in L ear 2/2 tympanostomy tubes as a child    Mouth/Throat:     Mouth: Mucous membranes are moist.     Pharynx: Oropharynx is clear.  Eyes:     Pupils: Pupils are equal, round, and reactive to light.  Cardiovascular:     Rate and Rhythm: Normal rate and regular rhythm.     Heart sounds: Normal heart sounds. No murmur heard.    No friction rub. No gallop.  Pulmonary:     Effort: Pulmonary effort is normal. No respiratory distress.     Breath sounds: Normal breath sounds.  Abdominal:     General: Abdomen is flat. Bowel sounds are normal.     Palpations: Abdomen is soft.  Musculoskeletal:        General: No swelling.     Cervical back: Neck supple.  Lymphadenopathy:     Cervical: No cervical adenopathy.  Skin:    General: Skin is warm and dry.  Neurological:     General: No focal deficit present.     Mental Status: She is alert.  Psychiatric:        Mood and Affect: Mood normal.        Behavior: Behavior normal.        Thought Content: Thought content normal.       Assessment & Plan:   Screen for colon cancer -     Ambulatory referral to Gastroenterology  General medical exam Assessment & Plan: Getting FBW today to include CBC, CMP, Lipid, A1c, TSH, and Hep C Screen. Answered all  patient questions. Went over and encouraged/ordered age-appropriate health screenings to include colonoscopy and pap smear. Patient is up-to-date on all other screenings. No vaccines today. Encouraged patient to aim for 150+ minutes of physical activity per  week or just increase their physical activity in general in combination with a well balanced diet that prioritizes protein and fiber to support a healthy lifestyle. Will plan for next physical in 1 year, sooner PRN. Patient verbalized understanding and was in agreement with the plan.    Hypotension, unspecified hypotension type Assessment & Plan: BP 88/60 on initial check, 95/62 on recheck. Patient reports that her blood pressure has always run low. She is asymptomatic. Reports that she has not eaten anything today and has not had much to drink. Advised her to increase her fluid intake and notify if she begins feeling faint or dizzy upon standing. Will cont to monitor.     Return in about 1 year (around 03/03/2025) for Physical.   Saddie JULIANNA Sacks, PA-C

## 2024-03-08 ENCOUNTER — Other Ambulatory Visit: Payer: Self-pay

## 2024-03-08 DIAGNOSIS — Z1159 Encounter for screening for other viral diseases: Secondary | ICD-10-CM

## 2024-03-08 DIAGNOSIS — Z130 Encounter for screening for diseases of the blood and blood-forming organs and certain disorders involving the immune mechanism: Secondary | ICD-10-CM

## 2024-03-08 DIAGNOSIS — Z6841 Body Mass Index (BMI) 40.0 and over, adult: Secondary | ICD-10-CM

## 2024-03-10 ENCOUNTER — Other Ambulatory Visit

## 2024-03-10 DIAGNOSIS — Z1159 Encounter for screening for other viral diseases: Secondary | ICD-10-CM

## 2024-03-10 DIAGNOSIS — Z130 Encounter for screening for diseases of the blood and blood-forming organs and certain disorders involving the immune mechanism: Secondary | ICD-10-CM

## 2024-03-10 DIAGNOSIS — Z6841 Body Mass Index (BMI) 40.0 and over, adult: Secondary | ICD-10-CM

## 2024-03-12 ENCOUNTER — Other Ambulatory Visit: Payer: Self-pay

## 2024-03-12 ENCOUNTER — Ambulatory Visit: Payer: Self-pay

## 2024-03-12 ENCOUNTER — Telehealth: Payer: Self-pay

## 2024-03-12 DIAGNOSIS — E559 Vitamin D deficiency, unspecified: Secondary | ICD-10-CM

## 2024-03-12 LAB — LIPID PANEL
Chol/HDL Ratio: 3.7 ratio (ref 0.0–4.4)
Cholesterol, Total: 227 mg/dL — ABNORMAL HIGH (ref 100–199)
HDL: 61 mg/dL (ref >39)
LDL Chol Calc (NIH): 133 mg/dL — ABNORMAL HIGH (ref 0–99)
Triglycerides: 187 mg/dL — ABNORMAL HIGH (ref 0–149)
VLDL Cholesterol Cal: 33 mg/dL (ref 5–40)

## 2024-03-12 LAB — COMPREHENSIVE METABOLIC PANEL WITH GFR
ALT: 30 IU/L (ref 0–32)
AST: 21 IU/L (ref 0–40)
Albumin: 3.9 g/dL (ref 3.9–4.9)
Alkaline Phosphatase: 106 IU/L (ref 44–121)
BUN/Creatinine Ratio: 11 (ref 9–23)
BUN: 10 mg/dL (ref 6–24)
Bilirubin Total: 0.3 mg/dL (ref 0.0–1.2)
CO2: 20 mmol/L (ref 20–29)
Calcium: 9 mg/dL (ref 8.7–10.2)
Chloride: 102 mmol/L (ref 96–106)
Creatinine, Ser: 0.94 mg/dL (ref 0.57–1.00)
Globulin, Total: 3.1 g/dL (ref 1.5–4.5)
Glucose: 96 mg/dL (ref 70–99)
Potassium: 4.2 mmol/L (ref 3.5–5.2)
Sodium: 136 mmol/L (ref 134–144)
Total Protein: 7 g/dL (ref 6.0–8.5)
eGFR: 77 mL/min/1.73 (ref >59)

## 2024-03-12 LAB — CBC WITH DIFFERENTIAL/PLATELET
Basophils Absolute: 0 x10E3/uL (ref 0.0–0.2)
Basos: 0 %
EOS (ABSOLUTE): 0.1 x10E3/uL (ref 0.0–0.4)
Eos: 1 %
Hematocrit: 40.3 % (ref 34.0–46.6)
Hemoglobin: 13 g/dL (ref 11.1–15.9)
Immature Grans (Abs): 0 x10E3/uL (ref 0.0–0.1)
Immature Granulocytes: 0 %
Lymphocytes Absolute: 2.5 x10E3/uL (ref 0.7–3.1)
Lymphs: 35 %
MCH: 29.6 pg (ref 26.6–33.0)
MCHC: 32.3 g/dL (ref 31.5–35.7)
MCV: 92 fL (ref 79–97)
Monocytes Absolute: 0.4 x10E3/uL (ref 0.1–0.9)
Monocytes: 6 %
Neutrophils Absolute: 4.3 x10E3/uL (ref 1.4–7.0)
Neutrophils: 58 %
Platelets: 315 x10E3/uL (ref 150–450)
RBC: 4.39 x10E6/uL (ref 3.77–5.28)
RDW: 13.1 % (ref 11.7–15.4)
WBC: 7.3 x10E3/uL (ref 3.4–10.8)

## 2024-03-12 LAB — HCV RNA QUANT RFLX ULTRA OR GENOTYP: HCV Quant Baseline: NOT DETECTED [IU]/mL

## 2024-03-12 LAB — HEMOGLOBIN A1C
Est. average glucose Bld gHb Est-mCnc: 117 mg/dL
Hgb A1c MFr Bld: 5.7 % — ABNORMAL HIGH (ref 4.8–5.6)

## 2024-03-12 LAB — VITAMIN D 25 HYDROXY (VIT D DEFICIENCY, FRACTURES): Vit D, 25-Hydroxy: 7.4 ng/mL — ABNORMAL LOW (ref 30.0–100.0)

## 2024-03-12 LAB — TSH: TSH: 1.46 u[IU]/mL (ref 0.450–4.500)

## 2024-03-12 MED ORDER — CHOLECALCIFEROL 1.25 MG (50000 UT) PO TABS: 50000.0000 [IU] | ORAL_TABLET | ORAL | 0 refills | Status: DC

## 2024-03-12 NOTE — Telephone Encounter (Signed)
 Copied from CRM #[id number]. Topic: Clinical - Lab/Test Results >> Mar 12, 2024 11:52 AM Avram MATSU wrote: Reason for CRM: I relayed the test results and patient stated rx can go to   [id number] [address], [place] - [address] [place] [address] Phone: [phone] Fax: [phone]

## 2024-04-21 ENCOUNTER — Encounter: Payer: Self-pay | Admitting: Pediatrics

## 2024-04-22 ENCOUNTER — Encounter: Payer: Self-pay | Admitting: Pediatrics

## 2024-05-18 ENCOUNTER — Encounter: Payer: Self-pay | Admitting: Pediatrics

## 2024-05-18 ENCOUNTER — Ambulatory Visit (AMBULATORY_SURGERY_CENTER)

## 2024-05-18 VITALS — Ht 60.5 in | Wt 217.0 lb

## 2024-05-18 DIAGNOSIS — Z1211 Encounter for screening for malignant neoplasm of colon: Secondary | ICD-10-CM

## 2024-05-18 MED ORDER — NA SULFATE-K SULFATE-MG SULF 17.5-3.13-1.6 GM/177ML PO SOLN: 1.0000 | Freq: Once | ORAL | 0 refills | Status: AC

## 2024-05-18 NOTE — Progress Notes (Signed)

## 2024-06-01 NOTE — Progress Notes (Unsigned)
  Gastroenterology History and Physical   Primary Care Physician:  Gayle Saddie JULIANNA DEVONNA   Reason for Procedure:  Colorectal cancer screening  Plan:    Screening colonoscopy     HPI: Teresa Kennedy is a 43 y.o. female undergoing screening colonoscopy for colorectal cancer screening.  This is the patient's first colonoscopy.  No family history of colon cancer or colon polyps.  Patient denies current symptoms of rectal bleeding or change in bowel habits.   Past Medical History:  Diagnosis Date   Angioedema of lips 07/27/2018   Asthma    mild asthma-no inhaler-seasonal allergy and stressed induced   GERD (gastroesophageal reflux disease)    no problems for several years. no meds at present   Seasonal allergies    Urticaria     Past Surgical History:  Procedure Laterality Date   CESAREAN SECTION  2008   CRYOTHERAPY  1999   DIAGNOSTIC LAPAROSCOPY  2009   ectopic    Prior to Admission medications   Medication Sig Start Date End Date Taking? Authorizing Provider  cetirizine (ZYRTEC) 10 MG tablet Take 20 mg by mouth daily.     [provider]  Cholecalciferol  1.25 MG (50000 UT) TABS Take 50,000 Units by mouth once a week. 03/12/24   Gayle Saddie JULIANNA, PA-C  famotidine  (PEPCID ) 20 MG tablet Take 20 mg by mouth as needed. 07/27/18   [provider]  Multiple Vitamins-Minerals (WOMENS MULTIVITAMIN PO) Take by mouth.    [provider]    Current Outpatient Medications  Medication Sig Dispense Refill   cetirizine (ZYRTEC) 10 MG tablet Take 20 mg by mouth daily.      Cholecalciferol  1.25 MG (50000 UT) TABS Take 50,000 Units by mouth once a week. 12 tablet 0   famotidine  (PEPCID ) 20 MG tablet Take 20 mg by mouth as needed.     Multiple Vitamins-Minerals (WOMENS MULTIVITAMIN PO) Take by mouth.     No current facility-administered medications for this visit.    Allergies as of 06/02/2024   (No Known Allergies)    Family History  Problem Relation Age  of Onset   Allergic rhinitis Mother    Asthma Brother    Eczema Neg Hx    Urticaria Neg Hx    BRCA 1/2 Neg Hx    Breast cancer Neg Hx    Colon cancer Neg Hx    Rectal cancer Neg Hx    Stomach cancer Neg Hx     Social History   Socioeconomic History   Marital status: Married    Spouse name: Not on file   Number of children: Not on file   Years of education: Not on file   Highest education level: Some college, no degree  Occupational History   Not on file  Tobacco Use   Smoking status: Former    Current packs/day: 0.00    Types: Cigarettes    Quit date: 11/03/2009    Years since quitting: 14.5   Smokeless tobacco: Never  Substance and Sexual Activity   Alcohol use: Yes   Drug use: No   Sexual activity: Not on file  Other Topics Concern   Not on file  Social History Narrative   Not on file   Social Drivers of Health   Financial Resource Strain: Low Risk  (03/02/2024)   Overall Financial Resource Strain (CARDIA)    Difficulty of Paying Living Expenses: Not very hard  Food Insecurity: Food Insecurity Present (03/02/2024)   Hunger Vital Sign  Worried About Programme researcher, broadcasting/film/video in the Last Year: Sometimes true    [employer] of Food in the Last Year: Never true  Transportation Needs: No Transportation Needs (03/02/2024)   PRAPARE - Administrator, Civil Service (Medical): No    Lack of Transportation (Non-Medical): No  Physical Activity: Inactive (03/02/2024)   Exercise Vital Sign    Days of Exercise per Week: 0 days    Minutes of Exercise per Session: Not on file  Stress: No Stress Concern Present (03/02/2024)   [employer] of Occupational Health - Occupational Stress Questionnaire    Feeling of Stress: Only a little  Social Connections: Moderately Integrated (03/02/2024)   Social Connection and Isolation Panel    Frequency of Communication with Friends and Family: More than three times a week    Frequency of Social Gatherings with Friends and Family:  Once a week    Attends Religious Services: 1 to 4 times per year    Active Member of [employer] or Organizations: No    Attends Engineer, structural: Not on file    Marital Status: Married  Catering manager Violence: Not on file    Review of Systems:  All other review of systems negative except as mentioned in the HPI.  Physical Exam: Vital signs There were no vitals taken for this visit.  General:   Alert,  Well-developed, well-nourished, pleasant and cooperative in NAD Airway:  Mallampati  Lungs:  Clear throughout to auscultation.   Heart:  Regular rate and rhythm; no murmurs, clicks, rubs,  or gallops. Abdomen:  Soft, nontender and nondistended. Normal bowel sounds.   Neuro/Psych:  Normal mood and affect. A and O x 3  Inocente Hausen, MD [facility]

## 2024-06-02 ENCOUNTER — Ambulatory Visit (AMBULATORY_SURGERY_CENTER): Admitting: Pediatrics

## 2024-06-02 ENCOUNTER — Encounter: Payer: Self-pay | Admitting: Pediatrics

## 2024-06-02 VITALS — BP 107/73 | HR 72 | Temp 98.1°F | Resp 10

## 2024-06-02 DIAGNOSIS — D125 Benign neoplasm of sigmoid colon: Secondary | ICD-10-CM

## 2024-06-02 DIAGNOSIS — K648 Other hemorrhoids: Secondary | ICD-10-CM

## 2024-06-02 DIAGNOSIS — Z1211 Encounter for screening for malignant neoplasm of colon: Secondary | ICD-10-CM | POA: Diagnosis present

## 2024-06-02 DIAGNOSIS — K635 Polyp of colon: Secondary | ICD-10-CM | POA: Diagnosis not present

## 2024-06-02 MED ORDER — SODIUM CHLORIDE 0.9 % IV SOLN: 500.0000 mL | Freq: Once | INTRAVENOUS | Status: DC

## 2024-06-02 NOTE — Progress Notes (Signed)
 Pt's states no medical or surgical changes since previsit or office visit.

## 2024-06-02 NOTE — Patient Instructions (Signed)
-  Handout on polyps, hemorrhoids provided -await pathology results -repeat colonoscopy for surveillance recommended. Date to be determined when pathology result become available   -Continue present medications   YOU HAD AN ENDOSCOPIC PROCEDURE TODAY AT THE [facility]:   Refer to the procedure report that was given to you for any specific questions about what was found during the examination.  If the procedure report does not answer your questions, please call your gastroenterologist to clarify.  If you requested that your care partner not be given the details of your procedure findings, then the procedure report has been included in a sealed envelope for you to review at your convenience later.  YOU SHOULD EXPECT: Some feelings of bloating in the abdomen. Passage of more gas than usual.  Walking can help get rid of the air that was put into your GI tract during the procedure and reduce the bloating. If you had a lower endoscopy (such as a colonoscopy or flexible sigmoidoscopy) you may notice spotting of blood in your stool or on the toilet paper. If you underwent a bowel prep for your procedure, you may not have a normal bowel movement for a few days.  Please Note:  You might notice some irritation and congestion in your nose or some drainage.  This is from the oxygen used during your procedure.  There is no need for concern and it should clear up in a day or so.  SYMPTOMS TO REPORT IMMEDIATELY:  Following lower endoscopy (colonoscopy or flexible sigmoidoscopy):  Excessive amounts of blood in the stool  Significant tenderness or worsening of abdominal pains  Swelling of the abdomen that is new, acute  Fever of 100F or higher   For urgent or emergent issues, a gastroenterologist can be reached at any hour by calling [phone]. Do not use MyChart messaging for urgent concerns.    DIET:  We do recommend a small meal at first, but then you may proceed to your regular diet.   Drink plenty of fluids but you should avoid alcoholic beverages for 24 hours.  ACTIVITY:  You should plan to take it easy for the rest of today and you should NOT DRIVE or use heavy machinery until tomorrow (because of the sedation medicines used during the test).    FOLLOW UP: Our staff will call the number listed on your records the next business day following your procedure.  We will call around 7:15- 8:00 am to check on you and address any questions or concerns that you may have regarding the information given to you following your procedure. If we do not reach you, we will leave a message.     If any biopsies were taken you will be contacted by phone or by letter within the next 1-3 weeks.  Please call us at [phone] if you have not heard about the biopsies in 3 weeks.    SIGNATURES/CONFIDENTIALITY: You and/or your care partner have signed paperwork which will be entered into your electronic medical record.  These signatures attest to the fact that that the information above on your After Visit Summary has been reviewed and is understood.  Full responsibility of the confidentiality of this discharge information lies with you and/or your care-partner.

## 2024-06-02 NOTE — Op Note (Signed)
 [facility] Patient Name: Teresa Kennedy Procedure Date: 06/02/2024 1:59 PM MRN: [id number] Endoscopist: Inocente Hausen , MD, [id number] Age: 43 Referring MD:  Date of Birth: Jan 12, 1981 Gender: Female Account #: [id number] Procedure:                Colonoscopy Indications:              Screening for colorectal malignant neoplasm, This                            is the patient's first colonoscopy Medicines:                Monitored Anesthesia Care Procedure:                Pre-Anesthesia Assessment:                           - Prior to the procedure, a History and Physical                            was performed, and patient medications and                            allergies were reviewed. The patient's tolerance of                            previous anesthesia was also reviewed. The risks                            and benefits of the procedure and the sedation                            options and risks were discussed with the patient.                            All questions were answered, and informed consent                            was obtained. Prior Anticoagulants: The patient has                            taken no anticoagulant or antiplatelet agents. ASA                            Grade Assessment: II - A patient with mild systemic                            disease. After reviewing the risks and benefits,                            the patient was deemed in satisfactory condition to                            undergo the procedure.  After obtaining informed consent, the colonoscope                            was passed under direct vision. Throughout the                            procedure, the patient's blood pressure, pulse, and                            oxygen saturations were monitored continuously. The                            CF [id number] #[id number] was introduced through the anus                            and advanced to the  cecum, identified by                            appendiceal orifice and ileocecal valve. The                            colonoscopy was performed without difficulty. The                            patient tolerated the procedure well. The quality                            of the bowel preparation was good. The ileocecal                            valve, appendiceal orifice, and rectum were                            photographed. Scope In: 2:04:53 PM Scope Out: 2:18:50 PM Scope Withdrawal Time: 0 hours 10 minutes 17 seconds  Total Procedure Duration: 0 hours 13 minutes 57 seconds  Findings:                 The perianal and digital rectal examinations were                            normal. Pertinent negatives include normal                            sphincter tone and no palpable rectal lesions.                           A 3 mm polyp was found in the sigmoid colon. The                            polyp was sessile. The polyp was removed with a                            cold biopsy forceps. Resection and retrieval were  complete.                           Internal hemorrhoids were found during retroflexion. Complications:            No immediate complications. Estimated blood loss:                            Minimal. Estimated Blood Loss:     Estimated blood loss was minimal. Impression:               - One 3 mm polyp in the sigmoid colon, removed with                            a cold biopsy forceps. Resected and retrieved.                           - Internal hemorrhoids. Recommendation:           - Discharge patient to home (ambulatory).                           - Await pathology results.                           - Repeat colonoscopy for surveillance based on                            pathology results.                           - The findings and recommendations were discussed                            with the patient's family.                            - Patient has a contact number available for                            emergencies. The signs and symptoms of potential                            delayed complications were discussed with the                            patient. Return to normal activities tomorrow.                            Written discharge instructions were provided to the                            patient. Inocente Hausen, MD 06/02/2024 2:22:07 PM This report has been signed electronically.

## 2024-06-02 NOTE — Progress Notes (Signed)
 Called to room to assist during endoscopic procedure.  Patient ID and intended procedure confirmed with present staff. Received instructions for my participation in the procedure from the performing physician.

## 2024-06-03 ENCOUNTER — Telehealth: Payer: Self-pay

## 2024-06-03 NOTE — Telephone Encounter (Signed)
  Follow up Call-     06/02/2024    1:14 PM  Call back number  Post procedure Call Back phone  # [phone]  Permission to leave phone message Yes     Patient questions:  Do you have a fever, pain , or abdominal swelling? Yes.   Pain Score  0 *  Have you tolerated food without any problems? No.  Have you been able to return to your normal activities? No.  Do you have any questions about your discharge instructions: Diet   No. Medications  No. Follow up visit  No.  Do you have questions or concerns about your Care? No.  Actions: * If pain score is 4 or above: No action needed, pain <4.

## 2024-06-08 LAB — SURGICAL PATHOLOGY

## 2024-06-09 ENCOUNTER — Ambulatory Visit: Payer: Self-pay | Admitting: Pediatrics

## 2024-06-12 ENCOUNTER — Other Ambulatory Visit: Payer: Self-pay

## 2024-06-12 DIAGNOSIS — E559 Vitamin D deficiency, unspecified: Secondary | ICD-10-CM

## 2025-03-04 ENCOUNTER — Encounter
